# Patient Record
Sex: Female | Born: 1940 | Race: White | Hispanic: No | Smoking: Former smoker
Health system: Southern US, Community
[De-identification: ages and names within clinical notes are randomized; demographics above are authoritative.]

## PROBLEM LIST (undated history)

## (undated) DIAGNOSIS — I10 Essential (primary) hypertension: Secondary | ICD-10-CM

## (undated) DIAGNOSIS — M1990 Unspecified osteoarthritis, unspecified site: Secondary | ICD-10-CM

## (undated) DIAGNOSIS — Z87442 Personal history of urinary calculi: Secondary | ICD-10-CM

## (undated) DIAGNOSIS — Z9889 Other specified postprocedural states: Secondary | ICD-10-CM

## (undated) DIAGNOSIS — I499 Cardiac arrhythmia, unspecified: Secondary | ICD-10-CM

## (undated) DIAGNOSIS — I2510 Atherosclerotic heart disease of native coronary artery without angina pectoris: Secondary | ICD-10-CM

## (undated) DIAGNOSIS — E039 Hypothyroidism, unspecified: Secondary | ICD-10-CM

## (undated) DIAGNOSIS — N189 Chronic kidney disease, unspecified: Secondary | ICD-10-CM

## (undated) DIAGNOSIS — E119 Type 2 diabetes mellitus without complications: Secondary | ICD-10-CM

## (undated) DIAGNOSIS — R112 Nausea with vomiting, unspecified: Secondary | ICD-10-CM

## (undated) DIAGNOSIS — K219 Gastro-esophageal reflux disease without esophagitis: Secondary | ICD-10-CM

## (undated) DIAGNOSIS — Z9289 Personal history of other medical treatment: Secondary | ICD-10-CM

## (undated) HISTORY — PX: CORONARY ARTERY BYPASS GRAFT: SHX141

## (undated) HISTORY — PX: BREAST SURGERY: SHX581

## (undated) HISTORY — PX: ABDOMINAL HYSTERECTOMY: SHX81

## (undated) HISTORY — PX: APPENDECTOMY: SHX54

## (undated) HISTORY — PX: TONSILLECTOMY: SUR1361

---

## 2012-04-09 ENCOUNTER — Encounter (HOSPITAL_COMMUNITY): Payer: Self-pay | Admitting: Pharmacy Technician

## 2012-04-12 ENCOUNTER — Ambulatory Visit (HOSPITAL_COMMUNITY)
Admission: RE | Admit: 2012-04-12 | Discharge: 2012-04-12 | Disposition: A | Discharge status: Home / Self Care | Payer: Medicare Other | Source: Ambulatory Visit | Attending: Orthopedic Surgery | Admitting: Orthopedic Surgery

## 2012-04-12 ENCOUNTER — Encounter (HOSPITAL_COMMUNITY): Payer: Self-pay

## 2012-04-12 ENCOUNTER — Encounter (HOSPITAL_COMMUNITY)
Admission: RE | Admit: 2012-04-12 | Discharge: 2012-04-12 | Disposition: A | Discharge status: Home / Self Care | Payer: Medicare Other | Source: Ambulatory Visit | Attending: Orthopedic Surgery | Admitting: Orthopedic Surgery

## 2012-04-12 DIAGNOSIS — Z01818 Encounter for other preprocedural examination: Secondary | ICD-10-CM | POA: Insufficient documentation

## 2012-04-12 DIAGNOSIS — Z01812 Encounter for preprocedural laboratory examination: Secondary | ICD-10-CM | POA: Insufficient documentation

## 2012-04-12 DIAGNOSIS — M1990 Unspecified osteoarthritis, unspecified site: Secondary | ICD-10-CM

## 2012-04-12 DIAGNOSIS — M169 Osteoarthritis of hip, unspecified: Secondary | ICD-10-CM | POA: Insufficient documentation

## 2012-04-12 DIAGNOSIS — M1610 Unilateral primary osteoarthritis, unspecified hip: Secondary | ICD-10-CM | POA: Insufficient documentation

## 2012-04-12 HISTORY — DX: Nausea with vomiting, unspecified: R11.2

## 2012-04-12 HISTORY — DX: Unspecified osteoarthritis, unspecified site: M19.90

## 2012-04-12 HISTORY — DX: Hypothyroidism, unspecified: E03.9

## 2012-04-12 HISTORY — DX: Personal history of urinary calculi: Z87.442

## 2012-04-12 HISTORY — DX: Other specified postprocedural states: Z98.890

## 2012-04-12 HISTORY — DX: Cardiac arrhythmia, unspecified: I49.9

## 2012-04-12 HISTORY — DX: Essential (primary) hypertension: I10

## 2012-04-12 HISTORY — DX: Gastro-esophageal reflux disease without esophagitis: K21.9

## 2012-04-12 LAB — SURGICAL PCR SCREEN: MRSA, PCR: NEGATIVE

## 2012-04-12 LAB — URINALYSIS, ROUTINE W REFLEX MICROSCOPIC
Bilirubin Urine: NEGATIVE
Glucose, UA: NEGATIVE mg/dL
Protein, ur: NEGATIVE mg/dL
Urobilinogen, UA: 0.2 mg/dL (ref 0.0–1.0)

## 2012-04-12 LAB — CBC
MCH: 30.6 pg (ref 26.0–34.0)
MCHC: 34 g/dL (ref 30.0–36.0)
MCV: 89.9 fL (ref 78.0–100.0)
Platelets: 318 10*3/uL (ref 150–400)
RBC: 4.15 MIL/uL (ref 3.87–5.11)
RDW: 12.9 % (ref 11.5–15.5)

## 2012-04-12 LAB — BASIC METABOLIC PANEL
BUN: 27 mg/dL — ABNORMAL HIGH (ref 6–23)
CO2: 27 mEq/L (ref 19–32)
Calcium: 10.1 mg/dL (ref 8.4–10.5)
Creatinine, Ser: 1.25 mg/dL — ABNORMAL HIGH (ref 0.50–1.10)
GFR calc non Af Amer: 43 mL/min — ABNORMAL LOW (ref >90)
Glucose, Bld: 109 mg/dL — ABNORMAL HIGH (ref 70–99)
Sodium: 136 mEq/L (ref 135–145)

## 2012-04-12 LAB — URINE MICROSCOPIC-ADD ON

## 2012-04-12 LAB — PROTIME-INR: Prothrombin Time: 12.6 seconds (ref 11.6–15.2)

## 2012-04-12 NOTE — Patient Instructions (Addendum)
20 RECIA SONS  04/12/2012   Your procedure is scheduled on:  12-19 -2013  Report to [facility] at    0630    AM.  Call this number if you have problems the morning of surgery: [phone]  Or Presurgical Testing [phone](Jendaya Gossett)   Donot eat or drink past midnight night before.  Take these medicines the morning of surgery with A SIP OF WATER: Amlodipine. Metoprolol. Omeprazole. Tylenol.   Do not wear jewelry, make-up or nail polish.  Do not wear lotions, powders, or perfumes. You may wear deodorant.  Do not shave 48 hours prior to surgery.(face and neck okay, no shaving of legs)  Do not bring valuables to the hospital.  Contacts, dentures or bridgework may not be worn into surgery.  Leave suitcase in the car. After surgery it may be brought to your room.  For patients admitted to the hospital, checkout time is 11:00 AM the day of discharge.   Patients discharged the day of surgery will not be allowed to drive home. Must have responsible person with you x 24 hours once discharged.  Name and phone number of your driver: Merton Border [phone]  Special Instructions: CHG Shower Use Special Wash: see special instructions.(avoid face and genitals)   Please read over the following fact sheets that you were given: MRSA Information, Blood Transfusion fact sheet, Incentive Spirometry Instruction.    Failure to follow these instructions may result in Cancellation of your surgery.   Patient signature_______________________________________________________

## 2012-04-12 NOTE — Progress Notes (Signed)
04-12-12 labs viewable in Epic, Please note BMP, Urine. CXR reports- done with Presurgical testing visit.W. Kennon Portela

## 2012-04-14 LAB — URINE CULTURE

## 2012-04-15 NOTE — Pre-Procedure Instructions (Signed)
04-12-12 CXR report and labs-noting urinalysis -faxed to Dr. Nilsa Nutting office. Viewable in Epic.Kathleen Raymond

## 2012-04-17 NOTE — Anesthesia Preprocedure Evaluation (Addendum)
Anesthesia Evaluation  Patient identified by MRN, date of birth, ID band Patient awake    Reviewed: Allergy & Precautions, H&P , NPO status , Patient's Chart, lab work & pertinent test results, reviewed documented beta blocker date and time   History of Anesthesia Complications (+) PONV  Airway Mallampati: II TM Distance: >3 FB Neck ROM: Full    Dental  (+) Dental Advisory Given and Teeth Intact   Pulmonary former smoker,  breath sounds clear to auscultation  Pulmonary exam normal       Cardiovascular hypertension, Pt. on home beta blockers and Pt. on medications - Past MI + dysrhythmias Atrial Fibrillation Rhythm:Regular Rate:Normal     Neuro/Psych negative neurological ROS  negative psych ROS   GI/Hepatic Neg liver ROS, GERD-  Medicated,  Endo/Other  Hypothyroidism   Renal/GU negative Renal ROS     Musculoskeletal  (+) Arthritis -, Osteoarthritis,    Abdominal (+) + obese,   Peds  Hematology negative hematology ROS (+)   Anesthesia Other Findings   Reproductive/Obstetrics negative OB ROS                         Anesthesia Physical Anesthesia Plan  ASA: II  Anesthesia Plan: Spinal   Post-op Pain Management:    Induction:   Airway Management Planned: Simple Face Mask  Additional Equipment:   Intra-op Plan:   Post-operative Plan:   Informed Consent: I have reviewed the patients History and Physical, chart, labs and discussed the procedure including the risks, benefits and alternatives for the proposed anesthesia with the patient or authorized representative who has indicated his/her understanding and acceptance.   Dental advisory given  Plan Discussed with: CRNA  Anesthesia Plan Comments:         Anesthesia Quick Evaluation

## 2012-04-17 NOTE — H&P (Signed)
TOTAL HIP ADMISSION H&P  Patient is admitted for right total hip arthroplasty, anterior approach.  Subjective:  Chief Complaint: Right hip OA / pain  HPI: Kathleen Raymond, 71 y.o. female, has a history of pain and functional disability in the right hip(s) due to arthritis and patient has failed non-surgical conservative treatments for greater than 12 weeks to include NSAID's and/or analgesics, corticosteriod injections and activity modification.  Onset of symptoms was gradual starting 3 years ago with gradually worsening course since that time.The patient noted no past surgery on the right hip(s).  Patient currently rates pain in the right hip at 7 out of 10 with activity. Patient has worsening of pain with activity and weight bearing, trendelenberg gait, pain that interfers with activities of daily living and pain with passive range of motion. Patient has evidence of periarticular osteophytes and joint space narrowing by imaging studies. This condition presents safety issues increasing the risk of falls. There is no current active infection. Risks, benefits and expectations were discussed with the patient. Patient understand the risks, benefits and expectations and wishes to proceed with surgery.   D/C Plans:  Home with HHPT  Post-op Meds:   Rx given for ASA, Robaxin, Iron, Colace and MiraLax  Tranexamic Acid:  To be given  Decadron:   To be given    Past Medical History  Diagnosis Date  . PONV (postoperative nausea and vomiting)   . Hypertension   . Dysrhythmia     past hx. Atrial Fib-no problems now  . Hypothyroidism   . History of kidney stones     x2-lithotripsy done  . GERD (gastroesophageal reflux disease)   . Arthritis 04-12-12    osteoarthritis    Past Surgical History  Procedure Date  . Breast surgery     Bilateral mastectomy/reconstruction-"hx. mutant  gene for breast cancer"  . Abdominal hysterectomy     with BSO, and Anteriot and Posterior repair  . Tonsillectomy    . Appendectomy     No prescriptions prior to admission   No Known Allergies  History  Substance Use Topics  . Smoking status: Former Smoker    Quit date: 04/12/1977  . Smokeless tobacco: Not on file  . Alcohol Use: Yes     Comment: rare occ.       Review of Systems  Constitutional: Negative.   HENT: Negative.   Eyes: Negative.   Respiratory: Negative.   Cardiovascular: Negative.   Gastrointestinal: Negative.   Genitourinary: Negative.   Musculoskeletal: Positive for joint pain.  Skin: Negative.   Neurological: Negative.   Endo/Heme/Allergies: Negative.     Objective:  Physical Exam  Constitutional: She is oriented to person, place, and time. She appears well-developed and well-nourished.  HENT:  Head: Normocephalic and atraumatic.  Mouth/Throat: Oropharynx is clear and moist.  Eyes: Pupils are equal, round, and reactive to light.  Neck: Neck supple. No JVD present. No tracheal deviation present. No thyromegaly present.  Cardiovascular: Normal rate, regular rhythm, normal heart sounds and intact distal pulses.   Respiratory: Effort normal and breath sounds normal. No stridor. No respiratory distress. She has no wheezes.  GI: Soft. There is no tenderness. There is no guarding.  Musculoskeletal:       Right hip: She exhibits decreased range of motion, decreased strength, tenderness and bony tenderness. She exhibits no swelling, no deformity and no laceration.  Lymphadenopathy:    She has no cervical adenopathy.  Neurological: She is alert and oriented to person, place, and time.  Skin: Skin is warm and dry.  Psychiatric: She has a normal mood and affect.      Imaging Review Plain radiographs demonstrate severe degenerative joint disease of the right hip(s). The bone quality appears to be good for age and reported activity level.  Assessment/Plan:  End stage arthritis, right hip(s)  The patient history, physical examination, clinical judgement of the provider  and imaging studies are consistent with end stage degenerative joint disease of the right hip(s) and total hip arthroplasty is deemed medically necessary. The treatment options including medical management, injection therapy, arthroscopy and arthroplasty were discussed at length. The risks and benefits of total hip arthroplasty were presented and reviewed. The risks due to aseptic loosening, infection, stiffness, dislocation/subluxation,  thromboembolic complications and other imponderables were discussed.  The patient acknowledged the explanation, agreed to proceed with the plan and consent was signed. Patient is being admitted for inpatient treatment for surgery, pain control, PT, OT, prophylactic antibiotics, VTE prophylaxis, progressive ambulation and ADL's and discharge planning.The patient is planning to be discharged home with home health services.     Kathleen Raymond Kathleen Raymond   PAC  04/17/2012, 5:28 PM

## 2012-04-18 ENCOUNTER — Inpatient Hospital Stay (HOSPITAL_COMMUNITY): Payer: Medicare Other | Admitting: Anesthesiology

## 2012-04-18 ENCOUNTER — Encounter (HOSPITAL_COMMUNITY): Payer: Self-pay | Admitting: Orthopedic Surgery

## 2012-04-18 ENCOUNTER — Inpatient Hospital Stay (HOSPITAL_COMMUNITY): Payer: Medicare Other

## 2012-04-18 ENCOUNTER — Encounter (HOSPITAL_COMMUNITY)
Admission: RE | Disposition: A | Discharge status: Home Health | Payer: Self-pay | Source: Ambulatory Visit | Attending: Orthopedic Surgery

## 2012-04-18 ENCOUNTER — Encounter (HOSPITAL_COMMUNITY): Payer: Self-pay | Admitting: Anesthesiology

## 2012-04-18 ENCOUNTER — Inpatient Hospital Stay (HOSPITAL_COMMUNITY)
Admission: RE | Admit: 2012-04-18 | Discharge: 2012-04-19 | DRG: 470 | Disposition: A | Discharge status: Home Health | Payer: Medicare Other | Source: Ambulatory Visit | Attending: Orthopedic Surgery | Admitting: Orthopedic Surgery

## 2012-04-18 ENCOUNTER — Encounter (HOSPITAL_COMMUNITY): Payer: Self-pay | Admitting: *Deleted

## 2012-04-18 DIAGNOSIS — M1610 Unilateral primary osteoarthritis, unspecified hip: Principal | ICD-10-CM | POA: Diagnosis present

## 2012-04-18 DIAGNOSIS — D500 Iron deficiency anemia secondary to blood loss (chronic): Secondary | ICD-10-CM

## 2012-04-18 DIAGNOSIS — Z96649 Presence of unspecified artificial hip joint: Secondary | ICD-10-CM

## 2012-04-18 DIAGNOSIS — D62 Acute posthemorrhagic anemia: Secondary | ICD-10-CM | POA: Diagnosis not present

## 2012-04-18 DIAGNOSIS — E669 Obesity, unspecified: Secondary | ICD-10-CM | POA: Diagnosis present

## 2012-04-18 DIAGNOSIS — M169 Osteoarthritis of hip, unspecified: Principal | ICD-10-CM | POA: Diagnosis present

## 2012-04-18 DIAGNOSIS — E871 Hypo-osmolality and hyponatremia: Secondary | ICD-10-CM | POA: Diagnosis not present

## 2012-04-18 HISTORY — PX: TOTAL HIP ARTHROPLASTY: SHX124

## 2012-04-18 LAB — TYPE AND SCREEN: Antibody Screen: NEGATIVE

## 2012-04-18 LAB — ABO/RH: ABO/RH(D): O POS

## 2012-04-18 SURGERY — ARTHROPLASTY, HIP, TOTAL, ANTERIOR APPROACH
Anesthesia: Spinal | Site: Hip | Laterality: Right | Wound class: Clean

## 2012-04-18 MED ORDER — ONDANSETRON HCL 4 MG PO TABS: 4.0000 mg | ORAL_TABLET | Freq: Four times a day (QID) | ORAL | Status: DC | PRN

## 2012-04-18 MED ORDER — FERROUS SULFATE 325 (65 FE) MG PO TABS
325.0000 mg | ORAL_TABLET | Freq: Three times a day (TID) | ORAL | Status: DC
  Administered 2012-04-18 – 2012-04-19 (×2): 325 mg via ORAL
  Filled 2012-04-18 (×5): qty 1

## 2012-04-18 MED ORDER — ACETAMINOPHEN 10 MG/ML IV SOLN
INTRAVENOUS | Status: DC | PRN
  Administered 2012-04-18: 1000 mg via INTRAVENOUS

## 2012-04-18 MED ORDER — RIVAROXABAN 10 MG PO TABS
10.0000 mg | ORAL_TABLET | Freq: Every day | ORAL | Status: DC
  Administered 2012-04-19: 10 mg via ORAL
  Filled 2012-04-18 (×2): qty 1

## 2012-04-18 MED ORDER — HYDROMORPHONE HCL PF 1 MG/ML IJ SOLN: 0.5000 mg | INTRAMUSCULAR | Status: DC | PRN

## 2012-04-18 MED ORDER — DIPHENHYDRAMINE HCL 25 MG PO CAPS: 25.0000 mg | ORAL_CAPSULE | Freq: Four times a day (QID) | ORAL | Status: DC | PRN

## 2012-04-18 MED ORDER — BUPIVACAINE HCL (PF) 0.5 % IJ SOLN
INTRAMUSCULAR | Status: DC | PRN
  Administered 2012-04-18: 3 mL

## 2012-04-18 MED ORDER — PHENYLEPHRINE HCL 10 MG/ML IJ SOLN
INTRAMUSCULAR | Status: DC | PRN
  Administered 2012-04-18: 40 ug via INTRAVENOUS
  Administered 2012-04-18 (×2): 80 ug via INTRAVENOUS
  Administered 2012-04-18 (×4): 40 ug via INTRAVENOUS

## 2012-04-18 MED ORDER — POLYETHYLENE GLYCOL 3350 17 G PO PACK
17.0000 g | PACK | Freq: Two times a day (BID) | ORAL | Status: DC
  Administered 2012-04-18 – 2012-04-19 (×2): 17 g via ORAL

## 2012-04-18 MED ORDER — ONDANSETRON HCL 4 MG/2ML IJ SOLN
INTRAMUSCULAR | Status: DC | PRN
  Administered 2012-04-18: 4 mg via INTRAVENOUS

## 2012-04-18 MED ORDER — LOSARTAN POTASSIUM 50 MG PO TABS
50.0000 mg | ORAL_TABLET | Freq: Two times a day (BID) | ORAL | Status: DC
  Administered 2012-04-18 – 2012-04-19 (×2): 50 mg via ORAL
  Filled 2012-04-18 (×3): qty 1

## 2012-04-18 MED ORDER — METOCLOPRAMIDE HCL 5 MG/ML IJ SOLN
INTRAMUSCULAR | Status: DC | PRN
  Administered 2012-04-18: 10 mg via INTRAVENOUS

## 2012-04-18 MED ORDER — LOSARTAN POTASSIUM-HCTZ 50-12.5 MG PO TABS: 1.0000 | ORAL_TABLET | Freq: Two times a day (BID) | ORAL | Status: DC

## 2012-04-18 MED ORDER — CHLORHEXIDINE GLUCONATE 4 % EX LIQD
60.0000 mL | Freq: Once | CUTANEOUS | Status: DC
  Filled 2012-04-18: qty 60

## 2012-04-18 MED ORDER — DEXAMETHASONE SODIUM PHOSPHATE 10 MG/ML IJ SOLN: 10.0000 mg | Freq: Once | INTRAMUSCULAR | Status: DC

## 2012-04-18 MED ORDER — LACTATED RINGERS IV SOLN
INTRAVENOUS | Status: DC | PRN
  Administered 2012-04-18 (×3): via INTRAVENOUS

## 2012-04-18 MED ORDER — CEFAZOLIN SODIUM-DEXTROSE 2-3 GM-% IV SOLR
2.0000 g | INTRAVENOUS | Status: AC
  Administered 2012-04-18: 2 g via INTRAVENOUS

## 2012-04-18 MED ORDER — METOCLOPRAMIDE HCL 10 MG PO TABS: 5.0000 mg | ORAL_TABLET | Freq: Three times a day (TID) | ORAL | Status: DC | PRN

## 2012-04-18 MED ORDER — ONDANSETRON HCL 4 MG/2ML IJ SOLN: 4.0000 mg | Freq: Four times a day (QID) | INTRAMUSCULAR | Status: DC | PRN

## 2012-04-18 MED ORDER — ALUM & MAG HYDROXIDE-SIMETH 200-200-20 MG/5ML PO SUSP: 30.0000 mL | ORAL | Status: DC | PRN

## 2012-04-18 MED ORDER — TRANEXAMIC ACID 100 MG/ML IV SOLN
15.0000 mg/kg | Freq: Once | INTRAVENOUS | Status: AC
  Administered 2012-04-18: 1128 mg via INTRAVENOUS
  Filled 2012-04-18: qty 12.8

## 2012-04-18 MED ORDER — METHOCARBAMOL 500 MG PO TABS
500.0000 mg | ORAL_TABLET | Freq: Four times a day (QID) | ORAL | Status: DC | PRN
  Administered 2012-04-18 (×2): 500 mg via ORAL
  Filled 2012-04-18 (×2): qty 1

## 2012-04-18 MED ORDER — DOCUSATE SODIUM 100 MG PO CAPS: 100.0000 mg | ORAL_CAPSULE | Freq: Two times a day (BID) | ORAL | Status: DC

## 2012-04-18 MED ORDER — AMLODIPINE BESYLATE 5 MG PO TABS
5.0000 mg | ORAL_TABLET | Freq: Every morning | ORAL | Status: DC
  Filled 2012-04-18: qty 1

## 2012-04-18 MED ORDER — BISACODYL 10 MG RE SUPP: 10.0000 mg | Freq: Every day | RECTAL | Status: DC | PRN

## 2012-04-18 MED ORDER — PANTOPRAZOLE SODIUM 40 MG PO TBEC
40.0000 mg | DELAYED_RELEASE_TABLET | Freq: Every day | ORAL | Status: DC
  Administered 2012-04-18 – 2012-04-19 (×2): 40 mg via ORAL
  Filled 2012-04-18 (×2): qty 1

## 2012-04-18 MED ORDER — METHOCARBAMOL 100 MG/ML IJ SOLN
500.0000 mg | Freq: Four times a day (QID) | INTRAVENOUS | Status: DC | PRN
  Filled 2012-04-18: qty 5

## 2012-04-18 MED ORDER — SODIUM CHLORIDE 0.9 % IV SOLN
100.0000 mL/h | INTRAVENOUS | Status: DC
  Administered 2012-04-18: 100 mL/h via INTRAVENOUS
  Filled 2012-04-18 (×8): qty 1000

## 2012-04-18 MED ORDER — CELECOXIB 200 MG PO CAPS
200.0000 mg | ORAL_CAPSULE | Freq: Two times a day (BID) | ORAL | Status: DC
  Administered 2012-04-18 – 2012-04-19 (×2): 200 mg via ORAL
  Filled 2012-04-18 (×3): qty 1

## 2012-04-18 MED ORDER — ALLOPURINOL 100 MG PO TABS
200.0000 mg | ORAL_TABLET | Freq: Every day | ORAL | Status: DC
  Administered 2012-04-18: 200 mg via ORAL
  Filled 2012-04-18 (×2): qty 2

## 2012-04-18 MED ORDER — DEXAMETHASONE SODIUM PHOSPHATE 10 MG/ML IJ SOLN
INTRAMUSCULAR | Status: DC | PRN
  Administered 2012-04-18: 10 mg via INTRAVENOUS

## 2012-04-18 MED ORDER — ZOLPIDEM TARTRATE 5 MG PO TABS: 5.0000 mg | ORAL_TABLET | Freq: Every evening | ORAL | Status: DC | PRN

## 2012-04-18 MED ORDER — LEVOTHYROXINE SODIUM 125 MCG PO TABS
125.0000 ug | ORAL_TABLET | Freq: Every day | ORAL | Status: DC
  Administered 2012-04-18: 125 ug via ORAL
  Filled 2012-04-18 (×2): qty 1

## 2012-04-18 MED ORDER — CEFAZOLIN SODIUM-DEXTROSE 2-3 GM-% IV SOLR
2.0000 g | Freq: Four times a day (QID) | INTRAVENOUS | Status: AC
  Administered 2012-04-18 (×2): 2 g via INTRAVENOUS
  Filled 2012-04-18 (×2): qty 50

## 2012-04-18 MED ORDER — DOCUSATE SODIUM 100 MG PO CAPS
100.0000 mg | ORAL_CAPSULE | Freq: Two times a day (BID) | ORAL | Status: DC
  Administered 2012-04-18 – 2012-04-19 (×3): 100 mg via ORAL

## 2012-04-18 MED ORDER — HYDROCHLOROTHIAZIDE 12.5 MG PO CAPS
12.5000 mg | ORAL_CAPSULE | Freq: Two times a day (BID) | ORAL | Status: DC
  Administered 2012-04-18 – 2012-04-19 (×2): 12.5 mg via ORAL
  Filled 2012-04-18 (×3): qty 1

## 2012-04-18 MED ORDER — FLEET ENEMA 7-19 GM/118ML RE ENEM: 1.0000 | ENEMA | Freq: Once | RECTAL | Status: AC | PRN

## 2012-04-18 MED ORDER — METOCLOPRAMIDE HCL 5 MG/ML IJ SOLN: 5.0000 mg | Freq: Three times a day (TID) | INTRAMUSCULAR | Status: DC | PRN

## 2012-04-18 MED ORDER — PHENOL 1.4 % MT LIQD: 1.0000 | OROMUCOSAL | Status: DC | PRN

## 2012-04-18 MED ORDER — PHENYLEPHRINE HCL 10 MG/ML IJ SOLN
10.0000 mg | INTRAVENOUS | Status: DC | PRN
  Administered 2012-04-18: 30 ug/min via INTRAVENOUS

## 2012-04-18 MED ORDER — 0.9 % SODIUM CHLORIDE (POUR BTL) OPTIME
TOPICAL | Status: DC | PRN
  Administered 2012-04-18: 1000 mL

## 2012-04-18 MED ORDER — DEXAMETHASONE SODIUM PHOSPHATE 10 MG/ML IJ SOLN
10.0000 mg | Freq: Once | INTRAMUSCULAR | Status: DC
  Filled 2012-04-18: qty 1

## 2012-04-18 MED ORDER — HYDROCODONE-ACETAMINOPHEN 7.5-325 MG PO TABS
1.0000 | ORAL_TABLET | ORAL | Status: DC
  Administered 2012-04-18 (×2): 2 via ORAL
  Administered 2012-04-18 – 2012-04-19 (×2): 1 via ORAL
  Filled 2012-04-18 (×2): qty 2
  Filled 2012-04-18: qty 1
  Filled 2012-04-18: qty 2

## 2012-04-18 MED ORDER — METOPROLOL TARTRATE 25 MG PO TABS
25.0000 mg | ORAL_TABLET | Freq: Every morning | ORAL | Status: DC
  Filled 2012-04-18: qty 1

## 2012-04-18 MED ORDER — MENTHOL 3 MG MT LOZG: 1.0000 | LOZENGE | OROMUCOSAL | Status: DC | PRN

## 2012-04-18 SURGICAL SUPPLY — 38 items
BAG ZIPLOCK 12X15 (MISCELLANEOUS) ×4 IMPLANT
BLADE SAW SGTL 18X1.27X75 (BLADE) ×2 IMPLANT
CLOTH BEACON ORANGE TIMEOUT ST (SAFETY) ×2 IMPLANT
DERMABOND ADVANCED (GAUZE/BANDAGES/DRESSINGS) ×1
DERMABOND ADVANCED .7 DNX12 (GAUZE/BANDAGES/DRESSINGS) ×1 IMPLANT
DRAPE C-ARM 42X72 X-RAY (DRAPES) ×2 IMPLANT
DRAPE STERI IOBAN 125X83 (DRAPES) ×2 IMPLANT
DRAPE U-SHAPE 47X51 STRL (DRAPES) ×6 IMPLANT
DRSG AQUACEL AG ADV 3.5X10 (GAUZE/BANDAGES/DRESSINGS) ×2 IMPLANT
DRSG TEGADERM 4X4.75 (GAUZE/BANDAGES/DRESSINGS) ×2 IMPLANT
DURAPREP 26ML APPLICATOR (WOUND CARE) ×2 IMPLANT
ELECT BLADE TIP CTD 4 INCH (ELECTRODE) ×2 IMPLANT
ELECT REM PT RETURN 9FT ADLT (ELECTROSURGICAL) ×2
ELECTRODE REM PT RTRN 9FT ADLT (ELECTROSURGICAL) ×1 IMPLANT
EVACUATOR 1/8 PVC DRAIN (DRAIN) IMPLANT
FACESHIELD LNG OPTICON STERILE (SAFETY) ×8 IMPLANT
GAUZE SPONGE 2X2 8PLY STRL LF (GAUZE/BANDAGES/DRESSINGS) ×1 IMPLANT
GLOVE BIOGEL PI IND STRL 7.5 (GLOVE) ×1 IMPLANT
GLOVE BIOGEL PI IND STRL 8 (GLOVE) ×1 IMPLANT
GLOVE BIOGEL PI INDICATOR 7.5 (GLOVE) ×1
GLOVE BIOGEL PI INDICATOR 8 (GLOVE) ×1
GLOVE ECLIPSE 8.0 STRL XLNG CF (GLOVE) ×2 IMPLANT
GLOVE ORTHO TXT STRL SZ7.5 (GLOVE) ×4 IMPLANT
GOWN BRE IMP PREV XXLGXLNG (GOWN DISPOSABLE) ×2 IMPLANT
GOWN STRL NON-REIN LRG LVL3 (GOWN DISPOSABLE) ×2 IMPLANT
KIT BASIN OR (CUSTOM PROCEDURE TRAY) ×2 IMPLANT
PACK TOTAL JOINT (CUSTOM PROCEDURE TRAY) ×2 IMPLANT
PADDING CAST COTTON 6X4 STRL (CAST SUPPLIES) ×2 IMPLANT
SPONGE GAUZE 2X2 STER 10/PKG (GAUZE/BANDAGES/DRESSINGS) ×1
SUCTION FRAZIER 12FR DISP (SUCTIONS) IMPLANT
SUT MNCRL AB 4-0 PS2 18 (SUTURE) ×2 IMPLANT
SUT VIC AB 1 CT1 36 (SUTURE) ×6 IMPLANT
SUT VIC AB 2-0 CT1 27 (SUTURE) ×2
SUT VIC AB 2-0 CT1 TAPERPNT 27 (SUTURE) ×2 IMPLANT
SUT VLOC 180 0 24IN GS25 (SUTURE) ×2 IMPLANT
TOWEL OR 17X26 10 PK STRL BLUE (TOWEL DISPOSABLE) ×4 IMPLANT
TRAY FOLEY CATH 14FRSI W/METER (CATHETERS) ×2 IMPLANT
WATER STERILE IRR 1500ML POUR (IV SOLUTION) ×4 IMPLANT

## 2012-04-18 NOTE — Evaluation (Signed)
Physical Therapy Evaluation Patient Details Name: Kathleen Raymond MRN: [id number] DOB: 1940-07-16 Today's Date: 04/18/2012 Time: 4098-1191 PT Time Calculation (min): 24 min  PT Assessment / Plan / Recommendation Clinical Impression  Pt. is 71 yo who had R THA-direct anterior today 12/19. Pt. tolerated very well and ambulated x 200 ft. Pain is <2. Pt. plans DC tomorrow. Has 24/7 caregivers arranged.    PT Assessment  Patient needs continued PT services    Follow Up Recommendations  Home health PT;Supervision/Assistance - 24 hour    Does the patient have the potential to tolerate intense rehabilitation      Barriers to Discharge        Equipment Recommendations  None recommended by PT    Recommendations for Other Services     Frequency 7X/week    Precautions / Restrictions Precautions Precautions: None Restrictions Weight Bearing Restrictions: No   Pertinent Vitals/Pain Premedicated <2 R hip.      Mobility  Bed Mobility Bed Mobility: Supine to Sit Supine to Sit: 4: Min guard Details for Bed Mobility Assistance: cues on how to move to side. Transfers Transfers: Sit to Stand;Stand to Sit Sit to Stand: 5: Supervision Stand to Sit: 5: Supervision Details for Transfer Assistance: cues on pushing from bed, reaching back to recliner. Ambulation/Gait Ambulation/Gait Assistance: 4: Min guard Ambulation Distance (Feet): 200 Feet Ambulation/Gait Assistance Details: cues to slow pace down, cues for sequence Gait Pattern: Step-through pattern;Decreased stance time - right    Shoulder Instructions     Exercises Total Joint Exercises Quad Sets: AROM;Right;10 reps;Supine Heel Slides: Right;AROM;10 reps;Supine Hip ABduction/ADduction: AAROM;Right;10 reps;Supine   PT Diagnosis: Difficulty walking  PT Problem List: Decreased strength;Decreased range of motion;Decreased activity tolerance PT Treatment Interventions: DME instruction;Gait training;Functional mobility  training;Therapeutic exercise;Therapeutic activities   PT Goals Acute Rehab PT Goals PT Goal Formulation: With patient/family Time For Goal Achievement: 04/25/12 Potential to Achieve Goals: Good Pt will go Supine/Side to Sit: Independently PT Goal: Supine/Side to Sit - Progress: Goal set today Pt will go Sit to Supine/Side: Independently PT Goal: Sit to Supine/Side - Progress: Goal set today Pt will go Sit to Stand: with supervision PT Goal: Sit to Stand - Progress: Goal set today Pt will go Stand to Sit: with supervision PT Goal: Stand to Sit - Progress: Goal set today Pt will Ambulate: >150 feet;with supervision;with rolling walker PT Goal: Ambulate - Progress: Goal set today Pt will Perform Home Exercise Program: Independently PT Goal: Perform Home Exercise Program - Progress: Goal set today  Visit Information  Last PT Received On: 04/18/12 Assistance Needed: +1    Subjective Data  Subjective: I want to walk. Patient Stated Goal: to go home tomorrow.   Prior Functioning  Home Living Lives With: Alone Available Help at Discharge: Family Type of Home: House Home Access: Level entry Home Layout: One level Bathroom Toilet: Handicapped height Home Adaptive Equipment: Environmental consultant - rolling;Straight cane Prior Function Level of Independence: Independent Driving: Yes Vocation: Retired Musician: No difficulties    Cognition  Overall Cognitive Status: Appears within functional limits for tasks assessed/performed Arousal/Alertness: Awake/alert Orientation Level: Appears intact for tasks assessed Behavior During Session: [facility] for tasks performed    Extremity/Trunk Assessment Right Lower Extremity Assessment RLE ROM/Strength/Tone: [facility] for tasks assessed (able to flex hip in supine w/ no assist,) RLE Sensation: WFL - Light Touch Left Lower Extremity Assessment LLE ROM/Strength/Tone: Within functional levels Trunk Assessment Trunk Assessment: Normal   Balance     End of Session PT -  End of Session Activity Tolerance: Patient tolerated treatment well Patient left: in chair;with call bell/phone within reach;with family/visitor present Nurse Communication: Mobility status  GP     Rada Hay 04/18/2012, 4:26 PM  [phone]

## 2012-04-18 NOTE — Anesthesia Procedure Notes (Signed)
Spinal  Patient location during procedure: OR Start time: 04/18/2012 9:28 AM End time: 04/18/2012 9:33 AM Staffing Anesthesiologist: Lewie Loron R Performed by: anesthesiologist  Preanesthetic Checklist Completed: patient identified, site marked, surgical consent, pre-op evaluation, timeout performed, IV checked, risks and benefits discussed and monitors and equipment checked Spinal Block Patient position: sitting Prep: ChloraPrep Patient monitoring: heart rate, continuous pulse ox and blood pressure Approach: right paramedian Location: L2-3 Injection technique: single-shot Needle Needle type: Sprotte  Needle gauge: 25 G Needle length: 9 cm Assessment Sensory level: T8 Additional Notes Expiration date of kit checked and confirmed. Patient tolerated procedure well, without complications.

## 2012-04-18 NOTE — Op Note (Signed)
NAME:  Kathleen Raymond                ACCOUNT NO.: [id number]      MEDICAL RECORD NO.: [id number]      FACILITY:  [facility]      PHYSICIAN:  Durene Romans D  DATE OF BIRTH:  12/29/1940     DATE OF PROCEDURE:  04/18/2012                                 OPERATIVE REPORT         PREOPERATIVE DIAGNOSIS: Right  hip osteoarthritis.      POSTOPERATIVE DIAGNOSIS:  Right hip osteoarthritis.      PROCEDURE:  Right total hip replacement through an anterior approach   utilizing DePuy THR system, component size 52mm pinnacle cup, a size 36+4 neutral   Altrex liner, a size 3 Hi Tri Lock stem with a 36+1.5 delta ceramic   ball.      SURGEON:  Madlyn Frankel. Charlann Boxer, M.D.      ASSISTANT:  Lanney Gins, PA      ANESTHESIA:  Spinal.      SPECIMENS:  None.      COMPLICATIONS:  None.      BLOOD LOSS:  600 cc     DRAINS:  One Hemovac.      INDICATION OF THE PROCEDURE:  [name] is a 71 y.o. female who had   presented to office for evaluation of right hip pain.  Radiographs revealed   progressive degenerative changes with bone-on-bone   articulation to the  hip joint.  The patient had painful limited range of   motion significantly affecting their overall quality of life.  The patient was failing to    respond to conservative measures, and at this point was ready   to proceed with more definitive measures.  The patient has noted progressive   degenerative changes in his hip, progressive problems and dysfunction   with regarding the hip prior to surgery.  Consent was obtained for   benefit of pain relief.  Specific risk of infection, DVT, component   failure, dislocation, need for revision surgery, as well discussion of   the anterior versus posterior approach were reviewed.  Consent was   obtained for benefit of anterior pain relief through an anterior   approach.      PROCEDURE IN DETAIL:  The patient was brought to operative theater.   Once adequate  anesthesia, preoperative antibiotics, 2gm Ancef administered.   The patient was positioned supine on the OSI Hanna table.  Once adequate   padding of boney process was carried out, we had predraped out the hip, and  used fluoroscopy to confirm orientation of the pelvis and position.      The right hip was then prepped and draped from proximal iliac crest to   mid thigh with shower curtain technique.      Time-out was performed identifying the patient, planned procedure, and   extremity.     An incision was then made 2 cm distal and lateral to the   anterior superior iliac spine extending over the orientation of the   tensor fascia lata muscle and sharp dissection was carried down to the   fascia of the muscle and protractor placed in the soft tissues.      The fascia was then incised.  The muscle belly was identified and  swept   laterally and retractor placed along the superior neck.  Following   cauterization of the circumflex vessels and removing some pericapsular   fat, a second cobra retractor was placed on the inferior neck.  A third   retractor was placed on the anterior acetabulum after elevating the   anterior rectus.  A L-capsulotomy was along the line of the   superior neck to the trochanteric fossa, then extended proximally and   distally.  Tag sutures were placed and the retractors were then placed   intracapsular.  We then identified the trochanteric fossa and   orientation of my neck cut, confirmed this radiographically   and then made a neck osteotomy with the femur on traction.  The femoral   head was removed without difficulty or complication.  Traction was let   off and retractors were placed posterior and anterior around the   acetabulum.      The labrum and foveal tissue were debrided.  I began reaming with a 45mm   reamer and reamed up to 51mm reamer with good bony bed preparation and a 52   cup was chosen.  The final 52mm Pinnacle cup was then impacted under  fluoroscopy  to confirm the depth of penetration and orientation with respect to   abduction.  A screw was placed followed by the hole eliminator.  The final   36+4 neutral Altrex liner was impacted with good visualized rim fit.  The cup was positioned anatomically within the acetabular portion of the pelvis.      At this point, the femur was rolled at 80 degrees.  Further capsule was   released off the inferior aspect of the femoral neck.  I then   released the superior capsule proximally.  The hook was placed laterally   along the femur and elevated manually and held in position with the bed   hook.  The leg was then extended and adducted with the leg rolled to 100   degrees of external rotation.  Once the proximal femur was fully   exposed, I used a box osteotome to set orientation.  I then began   broaching with the starting chili pepper broach and passed this by hand and then broached up to 3.  With the 3 broach in place I chose a high offset neck and did a trial reduction with the 36+1.5.  The offset was appropriate, leg lengths   appeared to be equal, confirmed radiographically.   Given these findings, I went ahead and dislocated the hip, repositioned all   retractors and positioned the right hip in the extended and abducted position.  The final 3 Hi Tri Lock stem was   chosen and it was impacted down to the level of neck cut.  Based on this   and the trial reduction, a 36+1.5 delta ceramic ball was chosen and   impacted onto a clean and dry trunnion, and the hip was reduced.  The   hip had been irrigated throughout the case again at this point.  I did   reapproximate the superior capsular leaflet to the anterior leaflet   using #1 Vicryl, placed a medium Hemovac drain deep.  The fascia of the   tensor fascia lata muscle was then reapproximated using #1 Vicryl.  The   remaining wound was closed with 2-0 Vicryl and running 4-0 Monocryl.   The hip was cleaned, dried, and dressed  sterilely using Dermabond and   Aquacel dressing.  Drain  site dressed separately.  She was then brought   to recovery room in stable condition tolerating the procedure well.    Lanney Gins, PA-C was present for the entirety of the case involved from   preoperative positioning, perioperative retractor management, general   facilitation of the case, as well as primary wound closure as assistant.            Madlyn Frankel Charlann Boxer, M.D.            MDO/MEDQ  D:  02/21/2011  T:  02/21/2011  Job:  [id number]      Electronically Signed by Durene Romans M.D. on 02/27/2011 09:15:38 AM

## 2012-04-18 NOTE — Progress Notes (Signed)
Utilization review completed.  

## 2012-04-18 NOTE — Preoperative (Signed)
Beta Blockers   Reason not to administer Beta Blockers:Not Applicable, took BB this am 

## 2012-04-18 NOTE — Anesthesia Postprocedure Evaluation (Signed)
Anesthesia Post Note  Patient: Kathleen Raymond  Procedure(s) Performed: Procedure(s) (LRB): TOTAL HIP ARTHROPLASTY ANTERIOR APPROACH (Right)  Anesthesia type: Spinal  Patient location: PACU  Post pain: Pain level controlled  Post assessment: Post-op Vital signs reviewed  Last Vitals: BP 134/62  Pulse 61  Temp 36.7 C (Oral)  Resp 16  Wt 188 lb (85.276 kg)  SpO2 100%  Post vital signs: Reviewed  Level of consciousness: sedated  Complications: No apparent anesthesia complications

## 2012-04-18 NOTE — Interval H&P Note (Signed)
History and Physical Interval Note:  04/18/2012 9:15 AM  Kathleen Raymond  has presented today for surgery, with the diagnosis of Osteoarthritis of the Right Hip  The various methods of treatment have been discussed with the patient and family. After consideration of risks, benefits and other options for treatment, the patient has consented to  Procedure(s) (LRB) with comments: TOTAL HIP ARTHROPLASTY ANTERIOR APPROACH (Right) as a surgical intervention .  The patient's history has been reviewed, patient examined, no change in status, stable for surgery.  I have reviewed the patient's chart and labs.  Questions were answered to the patient's satisfaction.     Shelda Pal

## 2012-04-18 NOTE — Transfer of Care (Signed)
Immediate Anesthesia Transfer of Care Note  Patient: Kathleen Raymond  Procedure(s) Performed: Procedure(s) (LRB) with comments: TOTAL HIP ARTHROPLASTY ANTERIOR APPROACH (Right)  Patient Location: PACU  Anesthesia Type:Spinal  Level of Consciousness: awake, alert  and patient cooperative  Airway & Oxygen Therapy: spontenous resp, RA   Post-op Assessment: Report given to PACU RN and Post -op Vital signs reviewed and stable, spinal T12  Post vital signs: Reviewed and stable  Complications: No apparent anesthesia complications

## 2012-04-19 ENCOUNTER — Encounter (HOSPITAL_COMMUNITY): Payer: Self-pay | Admitting: Orthopedic Surgery

## 2012-04-19 DIAGNOSIS — D500 Iron deficiency anemia secondary to blood loss (chronic): Secondary | ICD-10-CM

## 2012-04-19 DIAGNOSIS — E669 Obesity, unspecified: Secondary | ICD-10-CM

## 2012-04-19 DIAGNOSIS — E871 Hypo-osmolality and hyponatremia: Secondary | ICD-10-CM

## 2012-04-19 LAB — BASIC METABOLIC PANEL
Calcium: 9 mg/dL (ref 8.4–10.5)
GFR calc non Af Amer: 48 mL/min — ABNORMAL LOW (ref >90)
Glucose, Bld: 132 mg/dL — ABNORMAL HIGH (ref 70–99)
Sodium: 134 mEq/L — ABNORMAL LOW (ref 135–145)

## 2012-04-19 LAB — CBC
MCH: 31.2 pg (ref 26.0–34.0)
MCHC: 34.9 g/dL (ref 30.0–36.0)
Platelets: 221 10*3/uL (ref 150–400)
RDW: 13.1 % (ref 11.5–15.5)

## 2012-04-19 MED ORDER — METHOCARBAMOL 500 MG PO TABS: 500.0000 mg | ORAL_TABLET | Freq: Four times a day (QID) | ORAL | Status: DC | PRN

## 2012-04-19 MED ORDER — DIPHENHYDRAMINE HCL 25 MG PO CAPS: 25.0000 mg | ORAL_CAPSULE | Freq: Four times a day (QID) | ORAL | Status: DC | PRN

## 2012-04-19 MED ORDER — POLYETHYLENE GLYCOL 3350 17 G PO PACK: 17.0000 g | PACK | Freq: Two times a day (BID) | ORAL | Status: DC

## 2012-04-19 MED ORDER — ASPIRIN EC 325 MG PO TBEC: 325.0000 mg | DELAYED_RELEASE_TABLET | Freq: Two times a day (BID) | ORAL | Status: DC

## 2012-04-19 MED ORDER — FERROUS SULFATE 325 (65 FE) MG PO TABS: 325.0000 mg | ORAL_TABLET | Freq: Three times a day (TID) | ORAL | Status: DC

## 2012-04-19 MED ORDER — HYDROCODONE-ACETAMINOPHEN 7.5-325 MG PO TABS
1.0000 | ORAL_TABLET | ORAL | Status: DC | PRN
Start: 2012-04-19 — End: 2016-12-29

## 2012-04-19 MED ORDER — DSS 100 MG PO CAPS: 100.0000 mg | ORAL_CAPSULE | Freq: Two times a day (BID) | ORAL | Status: DC

## 2012-04-19 NOTE — Progress Notes (Signed)
CARE MANAGEMENT NOTE 04/19/2012  Patient:  Kathleen Raymond, Kathleen Raymond   Account Number:  [id number]  Date Initiated:  04/19/2012  Documentation initiated by:  Colleen Can  Subjective/Objective Assessment:   dx osteoarthritis right hip: total hip replacemnt    Pre-arranged with Genevieve Norlander for [facility] services prior to admission     Action/Plan:   Home with [facility] services..Services to start 04/20/2012  Pt discharged prior to being seen by CM   Anticipated DC Date:  04/19/2012   Anticipated DC Plan:  HOME W HOME HEALTH SERVICES      DC Planning Services  CM consult        HH arranged  HH-2 PT      [facility] agency  [facility]   Status of service:  Completed, signed off Medicare Important Message given?  NA - LOS <3 / Initial given by admissions (If response is "NO", the following Medicare IM given date fields will be blank) Discharge Disposition:  HOME W HOME HEALTH SERVICES

## 2012-04-19 NOTE — Progress Notes (Signed)
Physical Therapy Treatment Patient Details Name: Kathleen Raymond MRN: [id number] DOB: 1940/11/04 Today's Date: 04/19/2012 Time: 0865-7846 PT Time Calculation (min): 36 min  PT Assessment / Plan / Recommendation Comments on Treatment Session  POD # 1 and Pt plans to D/C to home today. Pt given handout on Anterior Approach THR TE's  and performed all.  No steps to enter the home. Pt demon good safety judgment and has met all goals to D/C today.    Follow Up Recommendations  Home health PT     Does the patient have the potential to tolerate intense rehabilitation     Barriers to Discharge        Equipment Recommendations  None recommended by PT    Recommendations for Other Services    Frequency 7X/week   Plan Discharge plan remains appropriate    Precautions / Restrictions Precautions Precautions: None Precaution Comments: Direct Anterior Approach THR Restrictions Weight Bearing Restrictions: No Other Position/Activity Restrictions: WBAT   Pertinent Vitals/Pain No c/o pain "tightness"    Mobility  Bed Mobility Bed Mobility: Sit to Supine Sit to Supine: 5: Supervision Details for Bed Mobility Assistance: increased time Transfers Transfers: Sit to Stand;Stand to Sit Sit to Stand: 5: Supervision;From chair/3-in-1 Stand to Sit: 5: Supervision;To bed Details for Transfer Assistance: good safety tech and hand placement Ambulation/Gait Ambulation/Gait Assistance: 5: Supervision Ambulation Distance (Feet): 175 Feet Assistive device: Rolling walker Ambulation/Gait Assistance Details: good alternating gait and safety cognition Gait Pattern: Step-through pattern;Decreased stance time - right    Exercises Total Joint Exercises Ankle Circles/Pumps: AROM;Both;10 reps;Supine Quad Sets: AROM;Both;10 reps;Supine Gluteal Sets: AROM;Both;10 reps;Supine Short Arc Quad: AROM;Right;10 reps;Supine Heel Slides: AROM;Right;10 reps;Supine Hip ABduction/ADduction: AROM;Right;10  reps;Supine Long Arc Quad: AROM;Right;10 reps;Seated Knee Flexion: AROM;Right;10 reps;Standing Standing Hip Extension: AROM;Right;10 reps;Standing   PT Goals                                                   progressing    Visit Information  Last PT Received On: 04/19/12 Assistance Needed: +1                   End of Session PT - End of Session Equipment Utilized During Treatment: Gait belt Activity Tolerance: Patient tolerated treatment well Patient left: in chair;with call bell/phone within reach;with family/visitor present (ICE to R hip)   Felecia Shelling  PTA [facility]  Acute  Rehab Pager     [phone]

## 2012-04-19 NOTE — Progress Notes (Signed)
   Subjective: 1 Day Post-Op Procedure(s) (LRB): TOTAL HIP ARTHROPLASTY ANTERIOR APPROACH (Right)   Patient reports pain as mild, pain well controlled. No events throughout the night. Ready to be discharged home after PT.  Objective:   VITALS:   Filed Vitals:   04/19/12 0603  BP: 121/75  Pulse: 66  Temp: 97.3 F (36.3 C)  Resp: 14    Neurovascular intact Dorsiflexion/Plantar flexion intact Incision: dressing C/D/I No cellulitis present Compartment soft  LABS  Basename 04/19/12 0420  HGB 8.8*  HCT 25.2*  WBC 10.0  PLT 221     Basename 04/19/12 0420  NA 134*  K 4.5  BUN 27*  CREATININE 1.14*  GLUCOSE 132*     Assessment/Plan: 1 Day Post-Op Procedure(s) (LRB): TOTAL HIP ARTHROPLASTY ANTERIOR APPROACH (Right) HV drain d/c'ed Foley cath d/c'ed Advance diet Up with therapy D/C IV fluids Discharge home with home health Follow [facility] 2 weeks [facility]. Follow up with OLIN,Hamish Banks D in 2 weeks.  Contact information:  [facility] [address] [place] [address] [phone]    Expected ABLA  Treated with iron and will observe  Obese (BMI 30-39.9) Estimated Body mass index is 31.12 kg/(m^2) as calculated from the following:   Height as of this encounter: 5\' 5" (1.651 m).   Weight as of this encounter: 187 lb(84.823 kg). Patient also counseled that weight may inhibit the healing process Patient counseled that losing weight will help with future health issues  Hyponatremia Treated with IV fluids and will observe       Anastasio Auerbach. Kalyb Pemble   PAC  04/19/2012, 7:30 AM

## 2012-04-19 NOTE — Evaluation (Signed)
Occupational Therapy Evaluation Patient Details Name: AMARIS DELAFUENTE MRN: [id number] DOB: Sep 10, 1940 Today's Date: 04/19/2012 Time:  -     OT Assessment / Plan / Recommendation Clinical Impression  Pt doing very well POD 1 THR (anterior). All education completed. Pt will have necessary level of A from family upon d/c and requires no equip.    OT Assessment  Patient does not need any further OT services    Follow Up Recommendations  No OT follow up    Barriers to Discharge      Equipment Recommendations  None recommended by OT    Recommendations for Other Services    Frequency       Precautions / Restrictions Precautions Precautions: None Restrictions Weight Bearing Restrictions: No   Pertinent Vitals/Pain Pt reported 2/10 pain in R hip. Repositioned and cold applied.    ADL  Grooming: Supervision/safety;Teeth care Where Assessed - Grooming: Supported standing Upper Body Bathing: Set up Where Assessed - Upper Body Bathing: Unsupported sitting Lower Body Bathing: Supervision/safety Where Assessed - Lower Body Bathing: Supported sit to stand Upper Body Dressing: Set up Where Assessed - Upper Body Dressing: Unsupported sitting Lower Body Dressing: Supervision/safety Where Assessed - Lower Body Dressing: Supported sit to Pharmacist, hospital: Supervision/safety Statistician Method: Sit to Barista: Comfort height toilet Toileting - Architect and Hygiene: Supervision/safety Where Assessed - Engineer, mining and Hygiene: Sit to stand from 3-in-1 or toilet Tub/Shower Transfer: Supervision/safety Tub/Shower Transfer Method: Science writer: Walk in Scientist, research (physical sciences) Used: Rolling walker Transfers/Ambulation Related to ADLs: Pt ambulated to the bathroom with supervision. Min cues for safe manipulation of RW at sink. ADL Comments: Pt was able to don underwear and socks without physical A or use  of an AD. Instructed pt how to safely step into a walk in shower with good return demo.    OT Diagnosis:    OT Problem List:   OT Treatment Interventions:     OT Goals    Visit Information       Subjective Data      Prior Functioning     Home Living Lives With: Alone Available Help at Discharge: Family Type of Home: House Home Access: Level entry Home Layout: One level Bathroom Shower/Tub: Health visitor: Handicapped height Home Adaptive Equipment: Straight cane;Walker - rolling Prior Function Level of Independence: Independent Driving: Yes Vocation: Retired Musician: No difficulties Dominant Hand: Right         Vision/Perception     Cognition  Overall Cognitive Status: Appears within functional limits for tasks assessed/performed Arousal/Alertness: Awake/alert Orientation Level: Appears intact for tasks assessed Behavior During Session: [facility] for tasks performed    Extremity/Trunk Assessment Right Upper Extremity Assessment RUE ROM/Strength/Tone: [facility] for tasks assessed Left Upper Extremity Assessment LUE ROM/Strength/Tone: WFL for tasks assessed     Mobility Bed Mobility Supine to Sit: 6: Modified independent (Device/Increase time);HOB elevated Transfers Sit to Stand: 5: Supervision;With upper extremity assist;From toilet;From bed Stand to Sit: 5: Supervision;With upper extremity assist;To chair/3-in-1;To toilet Details for Transfer Assistance: Pt demo'd good technique without VCs.     Shoulder Instructions     Exercise     Balance     End of Session OT - End of Session Activity Tolerance: Patient tolerated treatment well Patient left: in chair;with call bell/phone within reach  GO     Lennix Rotundo A OTR/L [phone] 04/19/2012, 8:41 AM

## 2012-04-19 NOTE — Discharge Summary (Signed)
Physician Discharge Summary  Patient ID: Kathleen Raymond MRN: [id number] DOB/AGE: 70-Dec-1942 70 y.o.  Admit date: 04/18/2012 Discharge date:  04/19/2012  Procedures:  Procedure(s) (LRB): TOTAL HIP ARTHROPLASTY ANTERIOR APPROACH (Right)  Attending Physician:  Dr. Durene Romans   Admission Diagnoses:   Right hip OA / pain  Discharge Diagnoses:  Principal Problem:  *S/P right THA, AA Active Problems:  Expected blood loss anemia  Obese  Hyponatremia   HPI: Kathleen Raymond, 71 y.o. female, has a history of pain and functional disability in the right hip(s) due to arthritis and patient has failed non-surgical conservative treatments for greater than 12 weeks to include NSAID's and/or analgesics, corticosteriod injections and activity modification. Onset of symptoms was gradual starting 3 years ago with gradually worsening course since that time.The patient noted no past surgery on the right hip(s). Patient currently rates pain in the right hip at 7 out of 10 with activity. Patient has worsening of pain with activity and weight bearing, trendelenberg gait, pain that interfers with activities of daily living and pain with passive range of motion. Patient has evidence of periarticular osteophytes and joint space narrowing by imaging studies. This condition presents safety issues increasing the risk of falls. There is no current active infection. Risks, benefits and expectations were discussed with the patient. Patient understand the risks, benefits and expectations and wishes to proceed with surgery.   PCP: Vernona Rieger, MD   Discharged Condition: good  Hospital Course:  Patient underwent the above stated procedure on 04/18/2012. Patient tolerated the procedure well and brought to the recovery room in good condition and subsequently to the floor.  POD #1 BP: 121/75 ; Pulse: 66 ; Temp: 97.3 F (36.3 C) ; Resp: 14  Pt's foley was removed, as well as the hemovac drain removed. IV  was changed to a saline lock. Patient reports pain as mild, pain well controlled. No events throughout the night. Ready to be discharged home after PT. Neurovascular intact, dorsiflexion/plantar flexion intact, incision: dressing C/D/I, no cellulitis present and compartment soft.   LABS  Basename  04/19/12 0420   HGB  8.8  HCT  25.2    Discharge Exam: General appearance: alert, cooperative and no distress Extremities: Homans sign is negative, no sign of DVT, no edema, redness or tenderness in the calves or thighs and no ulcers, gangrene or trophic changes  Disposition: Home with follow up in 2 weeks   Follow-up Information    Follow up with Shelda Pal, MD. Schedule an appointment as soon as possible for a visit in 2 weeks.   Contact information:   [address] [place] [address] [phone]          Discharge Orders    Future Orders Please Complete By Expires   Diet - low sodium heart healthy      Call MD / Call 911      Comments:   If you experience chest pain or shortness of breath, CALL 911 and be transported to the hospital emergency room.  If you develope a fever above 101 F, pus (white drainage) or increased drainage or redness at the wound, or calf pain, call your surgeon's office.   Discharge instructions      Comments:   Maintain surgical dressing for 10-14 days, then replace with gauze and tape. Keep the area dry and clean until follow up. Follow up in 2 weeks at [facility]. Call with any questions or concerns.   Constipation Prevention  Comments:   Drink plenty of fluids.  Prune juice may be helpful.  You may use a stool softener, such as Colace (over the counter) 100 mg twice a day.  Use MiraLax (over the counter) for constipation as needed.   Increase activity slowly as tolerated      Change dressing      Comments:   Maintain surgical dressing for 10-14 days, then replace with 4x4 guaze and tape. Keep the area dry and  clean.   TED hose      Comments:   Use stockings (TED hose) for 2 weeks on both leg(s).  You may remove them at night for sleeping.      Current Discharge Medication List    START taking these medications   Details  diphenhydrAMINE (BENADRYL) 25 mg capsule Take 1 capsule (25 mg total) by mouth every 6 (six) hours as needed for itching, allergies or sleep. Qty: 30 capsule    docusate sodium 100 MG CAPS Take 100 mg by mouth 2 (two) times daily. Qty: 10 capsule    ferrous sulfate 325 (65 FE) MG tablet Take 1 tablet (325 mg total) by mouth 3 (three) times daily after meals.    HYDROcodone-acetaminophen (NORCO) 7.5-325 MG per tablet Take 1-2 tablets by mouth every 4 (four) hours as needed for pain. Qty: 120 tablet, Refills: 0    methocarbamol (ROBAXIN) 500 MG tablet Take 1 tablet (500 mg total) by mouth every 6 (six) hours as needed (muscle spasms).    polyethylene glycol (MIRALAX / GLYCOLAX) packet Take 17 g by mouth 2 (two) times daily. Qty: 14 each      CONTINUE these medications which have CHANGED   Details  aspirin EC 325 MG tablet Take 1 tablet (325 mg total) by mouth 2 (two) times daily. X 4 weeks Qty: 60 tablet, Refills: 0      CONTINUE these medications which have NOT CHANGED   Details  allopurinol (ZYLOPRIM) 100 MG tablet Take 200 mg by mouth at bedtime.    amLODipine (NORVASC) 5 MG tablet Take 5 mg by mouth every morning.    fish oil-omega-3 fatty acids 1000 MG capsule Take 1 g by mouth 2 (two) times daily.    Glucosamine-Chondroitin 500-400 MG CAPS Take 3 capsules by mouth daily. Takes 1500mg /1200mg  (only one tablet)    levothyroxine (SYNTHROID, LEVOTHROID) 125 MCG tablet Take 125 mcg by mouth at bedtime.    losartan-hydrochlorothiazide (HYZAAR) 50-12.5 MG per tablet Take 1 tablet by mouth 2 (two) times daily.    metoprolol tartrate (LOPRESSOR) 25 MG tablet Take 25 mg by mouth every morning.    Multiple Vitamin (MULTIVITAMIN WITH MINERALS) TABS Take 1 tablet  by mouth daily.    omeprazole (PRILOSEC) 20 MG capsule Take 20 mg by mouth daily.      STOP taking these medications     meloxicam (MOBIC) 15 MG tablet            Signed: Anastasio Auerbach. Abia Monaco   PAC  04/19/2012, 7:38 AM

## 2012-04-19 NOTE — Progress Notes (Signed)
No changed from morning assessment.  Patient was discharged home with family.  AVS provided and reviewed with the patient and her daughter.

## 2014-01-15 IMAGING — CR DG HIP 1V PORT*R*
1 series · 1 of 1 positions shown · non-contrast
Comparison: None.

CLINICAL DATA: Status post right hip replacement

PORTABLE RIGHT HIP - 1 VIEW

[AP]
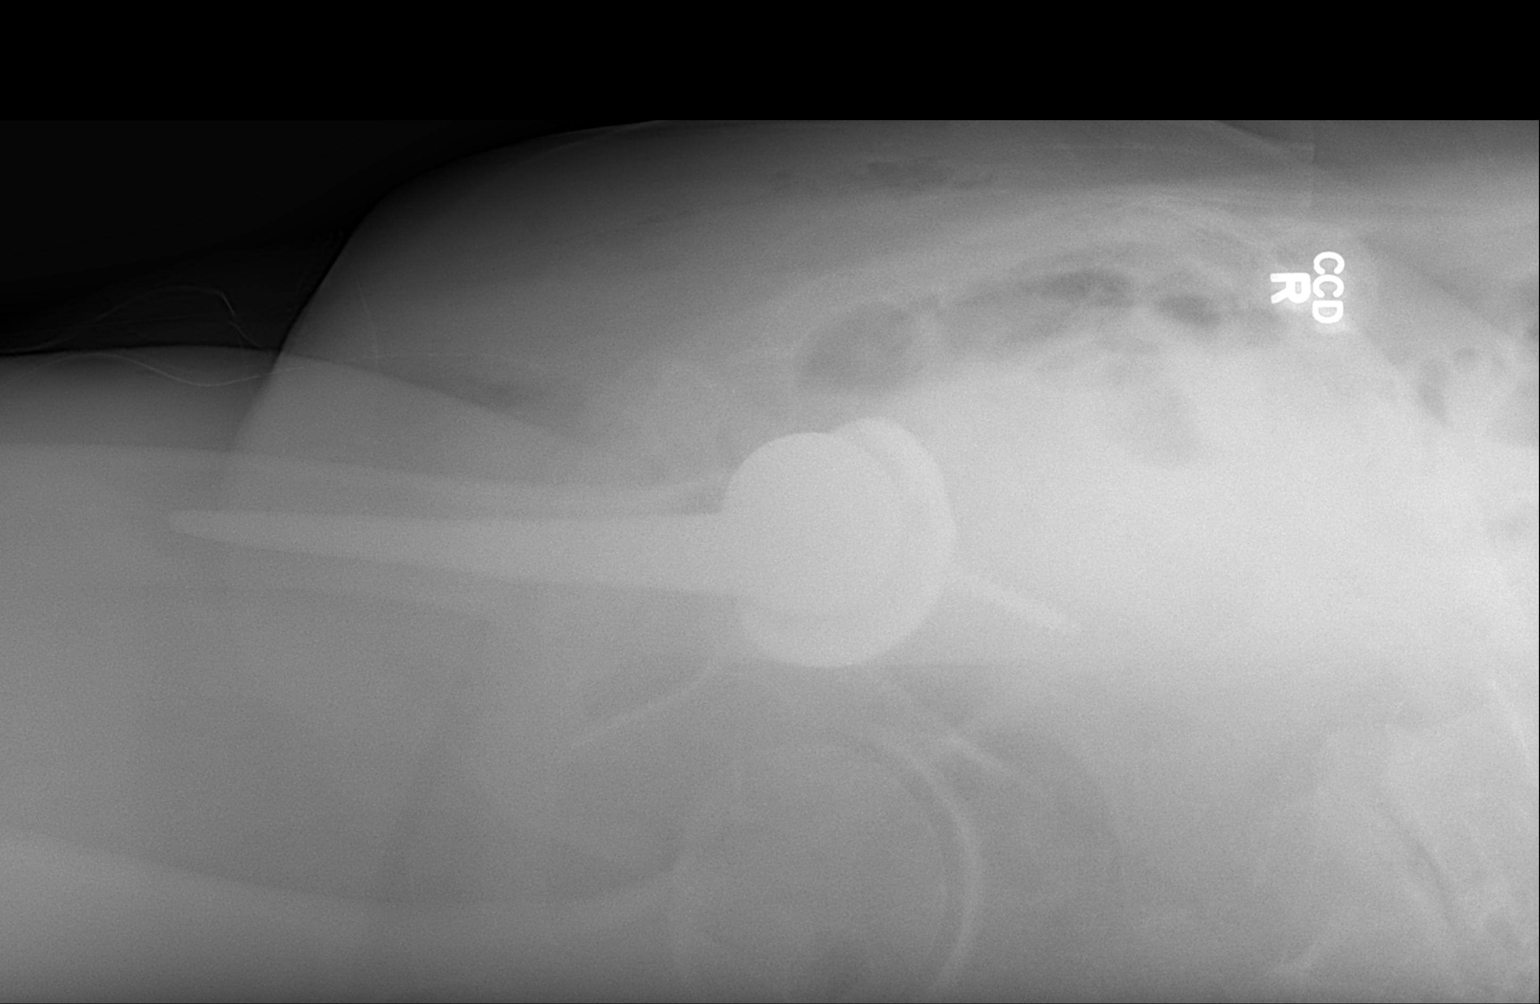

[1 of 1 positions shown; findings below may reference images not displayed]

FINDINGS: There are changes consistent with the right hip
replacement.  No dislocation is seen.  No acute bony abnormality is
noted.
IMPRESSION: Lateral view shows recent hip replacement on the right.  No acute
abnormality is noted.

## 2014-01-15 IMAGING — CR DG PORTABLE PELVIS
1 series · 1 of 1 positions shown · non-contrast
Comparison: Intraoperative films, same date.

CLINICAL DATA: Postop right total hip arthroplasty.

PORTABLE PELVIS

[AP]
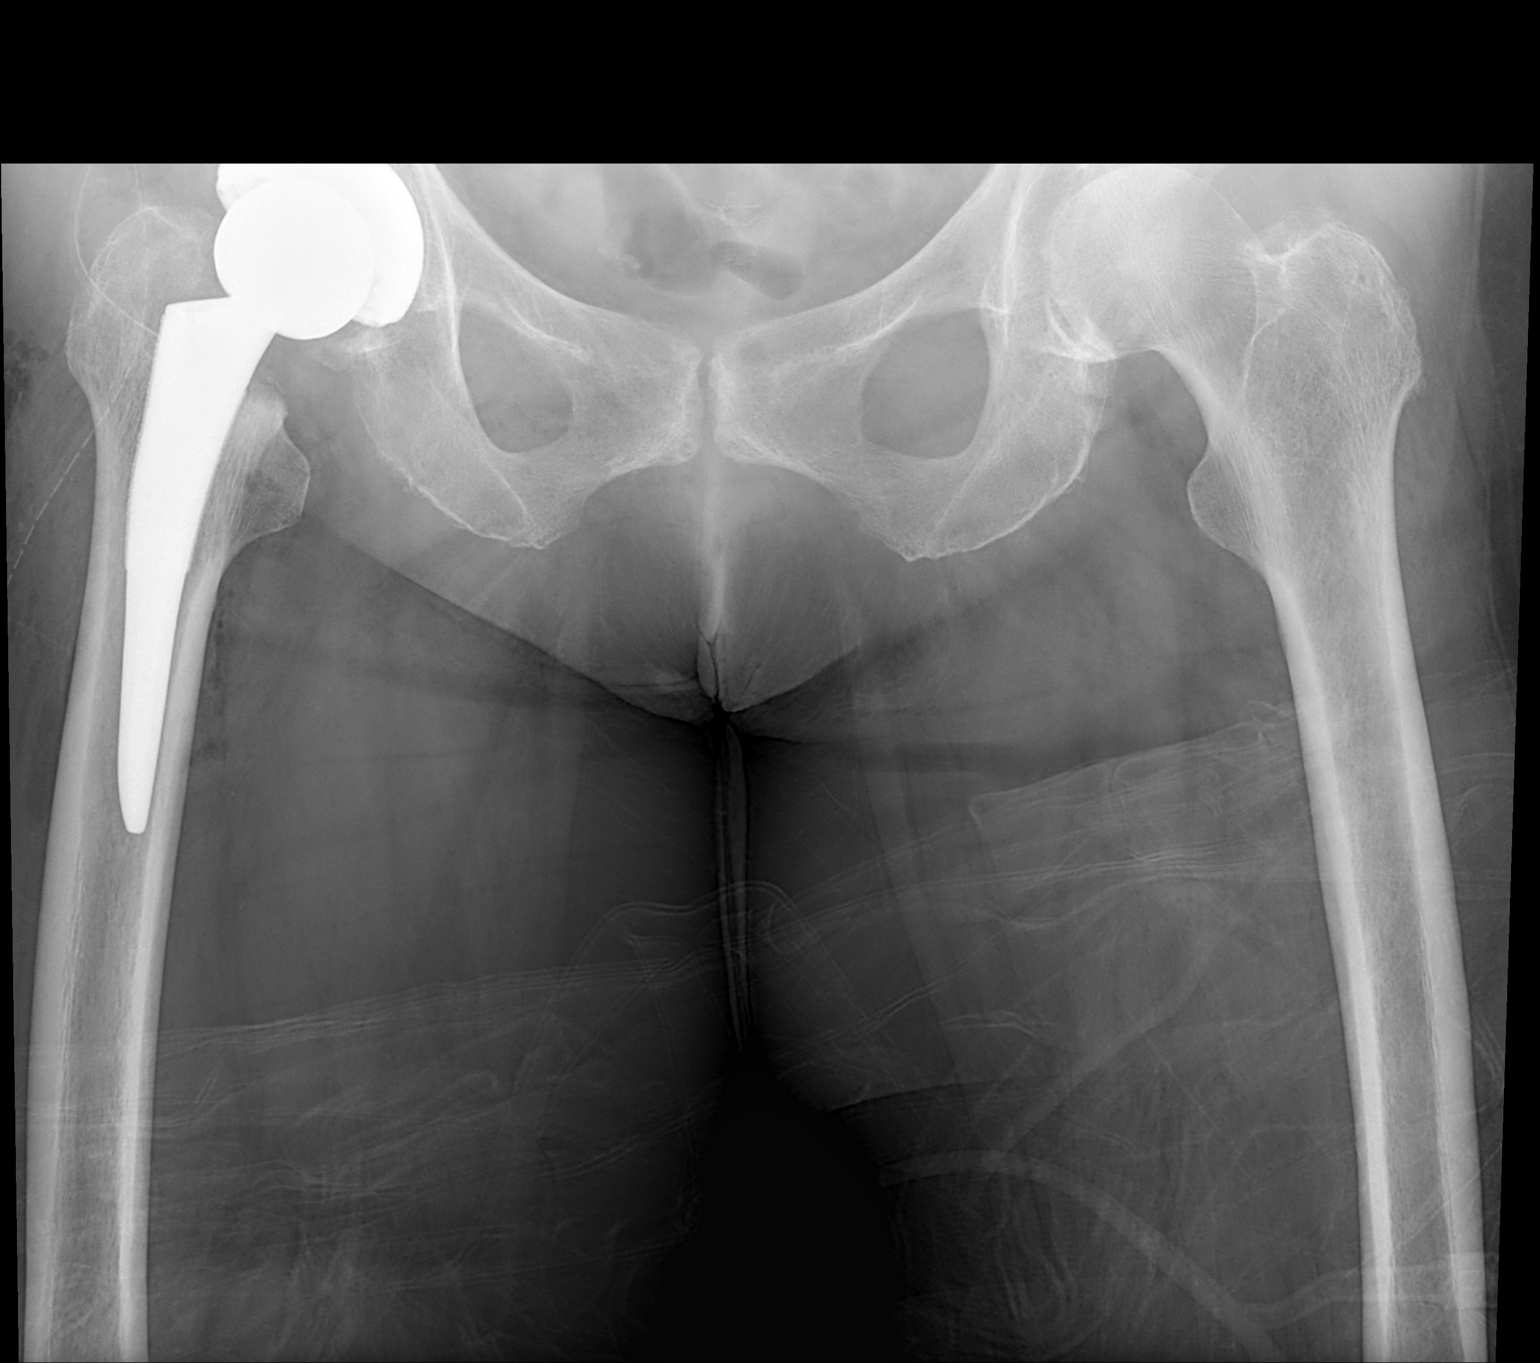

[1 of 1 positions shown; findings below may reference images not displayed]

FINDINGS: The femoral and acetabular components are well seated.
No complicating features.  The visualized lower pelvis is intact.
IMPRESSION: Well seated components of a total right hip arthroplasty.  No
complicating features.

## 2016-12-19 NOTE — Progress Notes (Signed)
Need orders in epic for 9-11 surgery 

## 2016-12-29 NOTE — Patient Instructions (Addendum)
Kathleen Raymond  12/29/2016   Your procedure is scheduled on: 01/09/17   [facility]  Follow signs to Short Stay on first floor at   0515 AM  Call this number if you have problems the morning of surgery [phone]   Remember: ONLY 1 PERSON MAY GO WITH YOU TO SHORT STAY TO GET  READY MORNING OF YOUR SURGERY.  Do not eat food or drink liquids :After Midnight.     Take these medicines the morning of surgery with A SIP OF WATER: Allopurinol, Allegra I fneeded, Synthroid, Metoprolol ( Lopressor), Prilosec if needed                                 You may not have any metal on your body including hair pins and              piercings  Do not wear jewelry, make-up, lotions, powders or perfumes, deodorant             Do not wear nail polish.  Do not shave  48 hours prior to surgery.                Do not bring valuables to the hospital. [facility] IS NOT             RESPONSIBLE   FOR VALUABLES.  Contacts, dentures or bridgework may not be worn into surgery.  Leave suitcase in the car. After surgery it may be brought to your room.                   Please read over the following fact sheets you were given: _____________________________________________________________________             [facility] - Preparing for [facility] Before surgery, you can play an important role.  Because skin is not sterile, your skin needs to be as free of germs as possible.  You can reduce the number of germs on your skin by washing with CHG (chlorahexidine gluconate) soap before surgery.  CHG is an antiseptic cleaner which kills germs and bonds with the skin to continue killing germs even after washing. Please DO NOT use if you have an allergy to CHG or antibacterial soaps.  If your skin becomes reddened/irritated stop using the CHG and inform your nurse when you arrive at Short Stay. Do not shave (including legs and underarms) for at least 48  hours prior to the first CHG shower.  You may shave your face/neck. Please follow these instructions carefully:  1.  Shower with CHG Soap the night before surgery and the  morning of Surgery.  2.  If you choose to wash your hair, wash your hair first as usual with your  normal  shampoo.  3.  After you shampoo, rinse your hair and body thoroughly to remove the  shampoo.                           4.  Use CHG as you would any other liquid soap.  You can apply chg directly  to the skin and wash                       Gently with a scrungie or clean  washcloth.  5.  Apply the CHG Soap to your body ONLY FROM THE NECK DOWN.   Do not use on face/ open                           Wound or open sores. Avoid contact with eyes, ears mouth and genitals (private parts).                       Wash face,  Genitals (private parts) with your normal soap.             6.  Wash thoroughly, paying special attention to the area where your surgery  will be performed.  7.  Thoroughly rinse your body with warm water from the neck down.  8.  DO NOT shower/wash with your normal soap after using and rinsing off  the CHG Soap.                9.  Pat yourself dry with a clean towel.            10.  Wear clean pajamas.            11.  Place clean sheets on your bed the night of your first shower and do not  sleep with pets. Day of Surgery : Do not apply any lotions/deodorants the morning of surgery.  Please wear clean clothes to the hospital/surgery center.  FAILURE TO FOLLOW THESE INSTRUCTIONS MAY RESULT IN THE CANCELLATION OF YOUR SURGERY PATIENT SIGNATURE_________________________________  NURSE SIGNATURE__________________________________  ________________________________________________________________________  WHAT IS A BLOOD TRANSFUSION? Blood Transfusion Information  A transfusion is the replacement of blood or some of its parts. Blood is made up of multiple cells which provide different functions.  Red blood cells  carry oxygen and are used for blood loss replacement.  White blood cells fight against infection.  Platelets control bleeding.  Plasma helps clot blood.  Other blood products are available for specialized needs, such as hemophilia or other clotting disorders. BEFORE THE TRANSFUSION  Who gives blood for transfusions?   Healthy volunteers who are fully evaluated to make sure their blood is safe. This is blood bank blood. Transfusion therapy is the safest it has ever been in the practice of medicine. Before blood is taken from a donor, a complete history is taken to make sure that person has no history of diseases nor engages in risky social behavior (examples are intravenous drug use or sexual activity with multiple partners). The donor's travel history is screened to minimize risk of transmitting infections, such as malaria. The donated blood is tested for signs of infectious diseases, such as HIV and hepatitis. The blood is then tested to be sure it is compatible with you in order to minimize the chance of a transfusion reaction. If you or a relative donates blood, this is often done in anticipation of surgery and is not appropriate for emergency situations. It takes many days to process the donated blood. RISKS AND COMPLICATIONS Although transfusion therapy is very safe and saves many lives, the main dangers of transfusion include:   Getting an infectious disease.  Developing a transfusion reaction. This is an allergic reaction to something in the blood you were given. Every precaution is taken to prevent this. The decision to have a blood transfusion has been considered carefully by your caregiver before blood is given. Blood is not given unless the benefits outweigh the risks. AFTER THE TRANSFUSION  Right after receiving a blood transfusion, you will usually feel much better and more energetic. This is especially true if your red blood cells have gotten low (anemic). The transfusion raises  the level of the red blood cells which carry oxygen, and this usually causes an energy increase.  The nurse administering the transfusion will monitor you carefully for complications. HOME CARE INSTRUCTIONS  No special instructions are needed after a transfusion. You may find your energy is better. Speak with your caregiver about any limitations on activity for underlying diseases you may have. SEEK MEDICAL CARE IF:   Your condition is not improving after your transfusion.  You develop redness or irritation at the intravenous (IV) site. SEEK IMMEDIATE MEDICAL CARE IF:  Any of the following symptoms occur over the next 12 hours:  Shaking chills.  You have a temperature by mouth above 102 F (38.9 C), not controlled by medicine.  Chest, back, or muscle pain.  People around you feel you are not acting correctly or are confused.  Shortness of breath or difficulty breathing.  Dizziness and fainting.  You get a rash or develop hives.  You have a decrease in urine output.  Your urine turns a dark color or changes to pink, red, or brown. Any of the following symptoms occur over the next 10 days:  You have a temperature by mouth above 102 F (38.9 C), not controlled by medicine.  Shortness of breath.  Weakness after normal activity.  The white part of the eye turns yellow (jaundice).  You have a decrease in the amount of urine or are urinating less often.  Your urine turns a dark color or changes to pink, red, or brown. Document Released: 04/14/2000 Document Revised: 07/10/2011 Document Reviewed: 12/02/2007 ExitCare Patient Information 2014 [place].  _______________________________________________________________________  Incentive Spirometer  An incentive spirometer is a tool that can help keep your lungs clear and active. This tool measures how well you are filling your lungs with each breath. Taking long deep breaths may help reverse or decrease the chance of  developing breathing (pulmonary) problems (especially infection) following:  A long period of time when you are unable to move or be active. BEFORE THE PROCEDURE   If the spirometer includes an indicator to show your best effort, your nurse or respiratory therapist will set it to a desired goal.  If possible, sit up straight or lean slightly forward. Try not to slouch.  Hold the incentive spirometer in an upright position. INSTRUCTIONS FOR USE  1. Sit on the edge of your bed if possible, or sit up as far as you can in bed or on a chair. 2. Hold the incentive spirometer in an upright position. 3. Breathe out normally. 4. Place the mouthpiece in your mouth and seal your lips tightly around it. 5. Breathe in slowly and as deeply as possible, raising the piston or the ball toward the top of the column. 6. Hold your breath for 3-5 seconds or for as long as possible. Allow the piston or ball to fall to the bottom of the column. 7. Remove the mouthpiece from your mouth and breathe out normally. 8. Rest for a few seconds and repeat Steps 1 through 7 at least 10 times every 1-2 hours when you are awake. Take your time and take a few normal breaths between deep breaths. 9. The spirometer may include an indicator to show your best effort. Use the indicator as a goal to work toward during each repetition. 10. After  each set of 10 deep breaths, practice coughing to be sure your lungs are clear. If you have an incision (the cut made at the time of surgery), support your incision when coughing by placing a pillow or rolled up towels firmly against it. Once you are able to get out of bed, walk around indoors and cough well. You may stop using the incentive spirometer when instructed by your caregiver.  RISKS AND COMPLICATIONS  Take your time so you do not get dizzy or light-headed.  If you are in pain, you may need to take or ask for pain medication before doing incentive spirometry. It is harder to take a  deep breath if you are having pain. AFTER USE  Rest and breathe slowly and easily.  It can be helpful to keep track of a log of your progress. Your caregiver can provide you with a simple table to help with this. If you are using the spirometer at home, follow these instructions: [place] IF:   You are having difficultly using the spirometer.  You have trouble using the spirometer as often as instructed.  Your pain medication is not giving enough relief while using the spirometer.  You develop fever of 100.5 F (38.1 C) or higher. SEEK IMMEDIATE MEDICAL CARE IF:   You cough up bloody sputum that had not been present before.  You develop fever of 102 F (38.9 C) or greater.  You develop worsening pain at or near the incision site. MAKE SURE YOU:   Understand these instructions.  Will watch your condition.  Will get help right away if you are not doing well or get worse. Document Released: 08/28/2006 Document Revised: 07/10/2011 Document Reviewed: 10/29/2006 [facility] Patient Information 2014 [place], [place].   ________________________________________________________________________

## 2017-01-03 ENCOUNTER — Encounter (HOSPITAL_COMMUNITY): Payer: Self-pay

## 2017-01-03 ENCOUNTER — Encounter (HOSPITAL_COMMUNITY)
Admission: RE | Admit: 2017-01-03 | Discharge: 2017-01-03 | Disposition: A | Discharge status: Home / Self Care | Payer: Medicare PPO | Source: Ambulatory Visit | Attending: Orthopedic Surgery | Admitting: Orthopedic Surgery

## 2017-01-03 DIAGNOSIS — Z01812 Encounter for preprocedural laboratory examination: Secondary | ICD-10-CM | POA: Diagnosis not present

## 2017-01-03 DIAGNOSIS — Z7982 Long term (current) use of aspirin: Secondary | ICD-10-CM | POA: Diagnosis not present

## 2017-01-03 DIAGNOSIS — Z79899 Other long term (current) drug therapy: Secondary | ICD-10-CM | POA: Diagnosis not present

## 2017-01-03 HISTORY — DX: Personal history of other medical treatment: Z92.89

## 2017-01-03 HISTORY — DX: Type 2 diabetes mellitus without complications: E11.9

## 2017-01-03 HISTORY — DX: Chronic kidney disease, unspecified: N18.9

## 2017-01-03 HISTORY — DX: Atherosclerotic heart disease of native coronary artery without angina pectoris: I25.10

## 2017-01-03 LAB — BASIC METABOLIC PANEL
Anion gap: 8 (ref 5–15)
BUN: 29 mg/dL — ABNORMAL HIGH (ref 6–20)
CHLORIDE: 102 mmol/L (ref 101–111)
CO2: 29 mmol/L (ref 22–32)
Calcium: 9.7 mg/dL (ref 8.9–10.3)
Creatinine, Ser: 1.27 mg/dL — ABNORMAL HIGH (ref 0.44–1.00)
GFR calc non Af Amer: 40 mL/min — ABNORMAL LOW (ref >60)
GFR, EST AFRICAN AMERICAN: 47 mL/min — AB (ref >60)
Glucose, Bld: 112 mg/dL — ABNORMAL HIGH (ref 65–99)
POTASSIUM: 4.8 mmol/L (ref 3.5–5.1)
SODIUM: 139 mmol/L (ref 135–145)

## 2017-01-03 LAB — CBC
HEMATOCRIT: 36.2 % (ref 36.0–46.0)
HEMOGLOBIN: 12.2 g/dL (ref 12.0–15.0)
MCH: 30.8 pg (ref 26.0–34.0)
MCHC: 33.7 g/dL (ref 30.0–36.0)
MCV: 91.4 fL (ref 78.0–100.0)
Platelets: 246 10*3/uL (ref 150–400)
RBC: 3.96 MIL/uL (ref 3.87–5.11)
RDW: 14 % (ref 11.5–15.5)
WBC: 5 10*3/uL (ref 4.0–10.5)

## 2017-01-03 LAB — HEMOGLOBIN A1C
Hgb A1c MFr Bld: 5.7 % — ABNORMAL HIGH (ref 4.8–5.6)
Mean Plasma Glucose: 116.89 mg/dL

## 2017-01-03 LAB — SURGICAL PCR SCREEN
MRSA, PCR: NEGATIVE
Staphylococcus aureus: NEGATIVE

## 2017-01-03 NOTE — Progress Notes (Signed)
DR Chestine Sporelark- 12/28/16-clearance on chart  Dr Willeen CassWahid- LOV and clearance- 12/20/16-on chart  EKG-12/25/16-on chart   ECHO- 08/08/16- on chart from Dr Chestine Sporelark

## 2017-01-03 NOTE — Progress Notes (Signed)
BMP done 01/03/17 faxed via epic to Dr Charlann Boxerlin.

## 2017-01-04 NOTE — H&P (Signed)
TOTAL HIP ADMISSION H&P  Patient is admitted for left total hip arthroplasty, anterior approach.  Subjective:  Chief Complaint: Left hip primary OA / pain  HPI: Kathleen Raymond, 76 y.o. female, has a history of pain and functional disability in the left hip(s) due to arthritis and patient has failed non-surgical conservative treatments for greater than 12 weeks to include NSAID's and/or analgesics, corticosteriod injections and activity modification.  Onset of symptoms was gradual starting <1 years ago with rapidlly worsening course since that time.The patient noted no past surgery on the left hip(s).  Patient currently rates pain in the left hip at 8 out of 10 with activity. Patient has night pain, worsening of pain with activity and weight bearing, trendelenberg gait, pain that interfers with activities of daily living and pain with passive range of motion. Patient has evidence of periarticular osteophytes and joint space narrowing by imaging studies. This condition presents safety issues increasing the risk of falls.  There is no current active infection.   Risks, benefits and expectations were discussed with the patient.  Risks including but not limited to the risk of anesthesia, blood clots, nerve damage, blood vessel damage, failure of the prosthesis, infection and up to and including death.  Patient understand the risks, benefits and expectations and wishes to proceed with surgery.   PCP: Vernona Rieger, MD  D/C Plans:       Home   Post-op Meds:       No Rx given   Tranexamic Acid:      To be given - IV    Decadron:      Is to be given  FYI:     ASA  Norco  DME:  Rx given for - RW and 3-n-1  PT:   No PT     Patient Active Problem List   Diagnosis Date Noted  . Expected blood loss anemia 04/19/2012  . Obese 04/19/2012  . Hyponatremia 04/19/2012  . 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 Arthritis 04-12-12   osteoarthritis  . Chronic  kidney disease   . Coronary artery disease   . Diabetes mellitus without complication (HCC)    ? never been on meds controlled with diet   . Dysrhythmia    past hx. Atrial Fib-no problems now  . GERD (gastroesophageal reflux disease)   . History of kidney stones    x2-lithotripsy done  . Hx of transfusion of packed red blood cells   . Hypertension   . Hypothyroidism   . PONV (postoperative nausea and vomiting)     Past Surgical History:  Procedure Laterality Date  . ABDOMINAL HYSTERECTOMY     with BSO, and Anteriot and Posterior repair  . APPENDECTOMY    . BREAST SURGERY     Bilateral mastectomy/reconstruction-"hx. mutant  gene for breast cancer"  . CORONARY ARTERY BYPASS GRAFT    . TONSILLECTOMY    . TOTAL HIP ARTHROPLASTY  04/18/2012   Procedure: TOTAL HIP ARTHROPLASTY ANTERIOR APPROACH;  Surgeon: Shelda Pal, MD;  Location: [facility] ORS;  Service: Orthopedics;  Laterality: Right;    No prescriptions prior to admission.   No Known Allergies  Social History  Substance Use Topics  . Smoking status: Former Smoker    Quit date: 04/12/1977  . Smokeless tobacco: Never Used  . Alcohol use Yes     Comment: rare occ.       Review of Systems  Constitutional: Negative.  HENT: Negative.   Eyes: Negative.   Respiratory: Negative.   Cardiovascular: Negative.   Gastrointestinal: Positive for heartburn.  Genitourinary: Negative.   Musculoskeletal: Positive for joint pain.  Skin: Negative.   Neurological: Negative.   Endo/Heme/Allergies: Negative.   Psychiatric/Behavioral: Negative.     Objective:  Physical Exam  Constitutional: She is oriented to person, place, and time. She appears well-developed.  HENT:  Head: Normocephalic.  Eyes: Pupils are equal, round, and reactive to light.  Neck: Neck supple. No JVD present. No tracheal deviation present. No thyromegaly present.  Cardiovascular: Normal rate, regular rhythm and intact distal pulses.   Respiratory: Effort normal  and breath sounds normal. No respiratory distress. She has no wheezes.  GI: Soft. There is no tenderness. There is no guarding.  Musculoskeletal:       Left hip: She exhibits decreased range of motion, decreased strength, tenderness and bony tenderness. She exhibits no swelling, no deformity and no laceration.  Lymphadenopathy:    She has no cervical adenopathy.  Neurological: She is alert and oriented to person, place, and time.  Skin: Skin is warm and dry.  Psychiatric: She has a normal mood and affect.      Labs:   Estimated body mass index is 31.41 kg/m as calculated from the following:   Height as of 01/03/17: 5\' 4"  (1.626 m).   Weight as of 01/03/17: 83 kg (183 lb).   Imaging Review Plain radiographs demonstrate severe degenerative joint disease of the left hip(s). The bone quality appears to be good for age and reported activity level.  Assessment/Plan:  End stage arthritis, left hip(s)  The patient history, physical examination, clinical judgement of the provider and imaging studies are consistent with end stage degenerative joint disease of the left hip(s) and total hip arthroplasty is deemed medically necessary. The treatment options including medical management, injection therapy, arthroscopy and arthroplasty were discussed at length. The risks and benefits of total hip arthroplasty were presented and reviewed. The risks due to aseptic loosening, infection, stiffness, dislocation/subluxation,  thromboembolic complications and other imponderables were discussed.  The patient acknowledged the explanation, agreed to proceed with the plan and consent was signed. Patient is being admitted for inpatient treatment for surgery, pain control, PT, OT, prophylactic antibiotics, VTE prophylaxis, progressive ambulation and ADL's and discharge planning.The patient is planning to be discharged home.    Anastasio AuerbachMatthew S. Estela Vinal   PA-C  01/04/2017, 10:59 PM

## 2017-01-09 ENCOUNTER — Inpatient Hospital Stay (HOSPITAL_COMMUNITY): Payer: Medicare PPO

## 2017-01-09 ENCOUNTER — Encounter (HOSPITAL_COMMUNITY): Payer: Self-pay

## 2017-01-09 ENCOUNTER — Encounter (HOSPITAL_COMMUNITY)
Admission: RE | Disposition: A | Discharge status: Home / Self Care | Payer: Self-pay | Source: Ambulatory Visit | Attending: Orthopedic Surgery

## 2017-01-09 ENCOUNTER — Inpatient Hospital Stay (HOSPITAL_COMMUNITY): Payer: Medicare PPO | Admitting: Registered Nurse

## 2017-01-09 ENCOUNTER — Inpatient Hospital Stay (HOSPITAL_COMMUNITY)
Admission: RE | Admit: 2017-01-09 | Discharge: 2017-01-10 | DRG: 470 | Disposition: A | Discharge status: Home / Self Care | Payer: Medicare PPO | Source: Ambulatory Visit | Attending: Orthopedic Surgery | Admitting: Orthopedic Surgery

## 2017-01-09 DIAGNOSIS — Z6831 Body mass index (BMI) 31.0-31.9, adult: Secondary | ICD-10-CM | POA: Diagnosis not present

## 2017-01-09 DIAGNOSIS — I129 Hypertensive chronic kidney disease with stage 1 through stage 4 chronic kidney disease, or unspecified chronic kidney disease: Secondary | ICD-10-CM | POA: Diagnosis present

## 2017-01-09 DIAGNOSIS — M1612 Unilateral primary osteoarthritis, left hip: Secondary | ICD-10-CM | POA: Diagnosis present

## 2017-01-09 DIAGNOSIS — E669 Obesity, unspecified: Secondary | ICD-10-CM | POA: Diagnosis present

## 2017-01-09 DIAGNOSIS — Z90710 Acquired absence of both cervix and uterus: Secondary | ICD-10-CM

## 2017-01-09 DIAGNOSIS — Z87891 Personal history of nicotine dependence: Secondary | ICD-10-CM

## 2017-01-09 DIAGNOSIS — E1122 Type 2 diabetes mellitus with diabetic chronic kidney disease: Secondary | ICD-10-CM | POA: Diagnosis present

## 2017-01-09 DIAGNOSIS — Z951 Presence of aortocoronary bypass graft: Secondary | ICD-10-CM | POA: Diagnosis not present

## 2017-01-09 DIAGNOSIS — K219 Gastro-esophageal reflux disease without esophagitis: Secondary | ICD-10-CM | POA: Diagnosis present

## 2017-01-09 DIAGNOSIS — Z96641 Presence of right artificial hip joint: Secondary | ICD-10-CM | POA: Diagnosis present

## 2017-01-09 DIAGNOSIS — Z9013 Acquired absence of bilateral breasts and nipples: Secondary | ICD-10-CM | POA: Diagnosis not present

## 2017-01-09 DIAGNOSIS — N189 Chronic kidney disease, unspecified: Secondary | ICD-10-CM | POA: Diagnosis present

## 2017-01-09 DIAGNOSIS — Z96649 Presence of unspecified artificial hip joint: Secondary | ICD-10-CM

## 2017-01-09 DIAGNOSIS — I2510 Atherosclerotic heart disease of native coronary artery without angina pectoris: Secondary | ICD-10-CM | POA: Diagnosis present

## 2017-01-09 DIAGNOSIS — Z96642 Presence of left artificial hip joint: Secondary | ICD-10-CM

## 2017-01-09 HISTORY — PX: TOTAL HIP ARTHROPLASTY: SHX124

## 2017-01-09 LAB — TYPE AND SCREEN
ABO/RH(D): O POS
Antibody Screen: NEGATIVE

## 2017-01-09 SURGERY — ARTHROPLASTY, HIP, TOTAL, ANTERIOR APPROACH
Anesthesia: Spinal | Site: Hip | Laterality: Left

## 2017-01-09 MED ORDER — PANTOPRAZOLE SODIUM 40 MG PO TBEC
40.0000 mg | DELAYED_RELEASE_TABLET | Freq: Every day | ORAL | Status: DC
  Administered 2017-01-10: 40 mg via ORAL
  Filled 2017-01-09: qty 1

## 2017-01-09 MED ORDER — KETOROLAC TROMETHAMINE 30 MG/ML IJ SOLN: 30.0000 mg | Freq: Once | INTRAMUSCULAR | Status: DC | PRN

## 2017-01-09 MED ORDER — PROPOFOL 500 MG/50ML IV EMUL
INTRAVENOUS | Status: DC | PRN
  Administered 2017-01-09: 25 ug/kg/min via INTRAVENOUS

## 2017-01-09 MED ORDER — PHENYLEPHRINE HCL 10 MG/ML IJ SOLN
INTRAMUSCULAR | Status: AC
  Filled 2017-01-09: qty 1

## 2017-01-09 MED ORDER — LOSARTAN POTASSIUM 50 MG PO TABS
50.0000 mg | ORAL_TABLET | Freq: Two times a day (BID) | ORAL | Status: DC
  Administered 2017-01-09 – 2017-01-10 (×2): 50 mg via ORAL
  Filled 2017-01-09 (×2): qty 1

## 2017-01-09 MED ORDER — ALLOPURINOL 100 MG PO TABS
100.0000 mg | ORAL_TABLET | Freq: Two times a day (BID) | ORAL | Status: DC
  Administered 2017-01-09 – 2017-01-10 (×2): 100 mg via ORAL
  Filled 2017-01-09 (×2): qty 1

## 2017-01-09 MED ORDER — PROPOFOL 10 MG/ML IV BOLUS
INTRAVENOUS | Status: DC | PRN
Start: 2017-01-09 — End: 2017-01-09
  Administered 2017-01-09: 20 mg via INTRAVENOUS

## 2017-01-09 MED ORDER — TRANEXAMIC ACID 1000 MG/10ML IV SOLN
1000.0000 mg | Freq: Once | INTRAVENOUS | Status: AC
  Administered 2017-01-09: 1000 mg via INTRAVENOUS
  Filled 2017-01-09: qty 1100

## 2017-01-09 MED ORDER — METOCLOPRAMIDE HCL 5 MG/ML IJ SOLN: 5.0000 mg | Freq: Three times a day (TID) | INTRAMUSCULAR | Status: DC | PRN

## 2017-01-09 MED ORDER — DIPHENHYDRAMINE HCL 25 MG PO CAPS: 25.0000 mg | ORAL_CAPSULE | Freq: Four times a day (QID) | ORAL | Status: DC | PRN

## 2017-01-09 MED ORDER — HYDROCHLOROTHIAZIDE 12.5 MG PO CAPS
12.5000 mg | ORAL_CAPSULE | Freq: Every day | ORAL | Status: DC
  Administered 2017-01-10: 12.5 mg via ORAL
  Filled 2017-01-09: qty 1

## 2017-01-09 MED ORDER — HYDROCODONE-ACETAMINOPHEN 7.5-325 MG PO TABS
1.0000 | ORAL_TABLET | ORAL | Status: DC
  Administered 2017-01-09 (×2): 1 via ORAL
  Administered 2017-01-10: 2 via ORAL
  Filled 2017-01-09 (×3): qty 1
  Filled 2017-01-09: qty 2

## 2017-01-09 MED ORDER — CHLORHEXIDINE GLUCONATE 4 % EX LIQD: 60.0000 mL | Freq: Once | CUTANEOUS | Status: DC

## 2017-01-09 MED ORDER — SODIUM CHLORIDE 0.9 % IV SOLN
INTRAVENOUS | Status: DC
  Administered 2017-01-09: 13:00:00 via INTRAVENOUS

## 2017-01-09 MED ORDER — METHOCARBAMOL 500 MG PO TABS: 500.0000 mg | ORAL_TABLET | Freq: Four times a day (QID) | ORAL | 0 refills | Status: AC | PRN

## 2017-01-09 MED ORDER — PROMETHAZINE HCL 25 MG/ML IJ SOLN: 6.2500 mg | INTRAMUSCULAR | Status: DC | PRN

## 2017-01-09 MED ORDER — DEXAMETHASONE SODIUM PHOSPHATE 10 MG/ML IJ SOLN
INTRAMUSCULAR | Status: AC
  Filled 2017-01-09: qty 1

## 2017-01-09 MED ORDER — DOCUSATE SODIUM 100 MG PO CAPS
100.0000 mg | ORAL_CAPSULE | Freq: Two times a day (BID) | ORAL | Status: DC
  Filled 2017-01-09 (×2): qty 1

## 2017-01-09 MED ORDER — FERROUS SULFATE 325 (65 FE) MG PO TABS
325.0000 mg | ORAL_TABLET | Freq: Three times a day (TID) | ORAL | Status: DC
Start: 2017-01-10 — End: 2017-01-10
  Administered 2017-01-10: 325 mg via ORAL
  Filled 2017-01-09: qty 1

## 2017-01-09 MED ORDER — MENTHOL 3 MG MT LOZG: 1.0000 | LOZENGE | OROMUCOSAL | Status: DC | PRN

## 2017-01-09 MED ORDER — STERILE WATER FOR IRRIGATION IR SOLN
Status: DC | PRN
  Administered 2017-01-09: 2000 mL

## 2017-01-09 MED ORDER — MEPERIDINE HCL 50 MG/ML IJ SOLN: 6.2500 mg | INTRAMUSCULAR | Status: DC | PRN

## 2017-01-09 MED ORDER — LIDOCAINE 2% (20 MG/ML) 5 ML SYRINGE
INTRAMUSCULAR | Status: AC
  Filled 2017-01-09: qty 5

## 2017-01-09 MED ORDER — BISACODYL 10 MG RE SUPP
10.0000 mg | Freq: Every day | RECTAL | Status: DC | PRN
Start: 2017-01-09 — End: 2017-01-10

## 2017-01-09 MED ORDER — TRANEXAMIC ACID 1000 MG/10ML IV SOLN
1000.0000 mg | INTRAVENOUS | Status: AC
  Administered 2017-01-09: 1000 mg via INTRAVENOUS
  Filled 2017-01-09: qty 1100

## 2017-01-09 MED ORDER — PROPOFOL 10 MG/ML IV BOLUS
INTRAVENOUS | Status: AC
  Filled 2017-01-09: qty 60

## 2017-01-09 MED ORDER — LACTATED RINGERS IV SOLN
INTRAVENOUS | Status: DC
  Administered 2017-01-09: 08:00:00 via INTRAVENOUS
  Administered 2017-01-09: 1000 mL via INTRAVENOUS

## 2017-01-09 MED ORDER — PHENYLEPHRINE 40 MCG/ML (10ML) SYRINGE FOR IV PUSH (FOR BLOOD PRESSURE SUPPORT)
PREFILLED_SYRINGE | INTRAVENOUS | Status: DC | PRN
  Administered 2017-01-09 (×3): 80 ug via INTRAVENOUS

## 2017-01-09 MED ORDER — DEXTROSE 5 % IV SOLN
500.0000 mg | Freq: Four times a day (QID) | INTRAVENOUS | Status: DC | PRN
  Filled 2017-01-09: qty 5

## 2017-01-09 MED ORDER — ONDANSETRON HCL 4 MG/2ML IJ SOLN: 4.0000 mg | Freq: Four times a day (QID) | INTRAMUSCULAR | Status: DC | PRN

## 2017-01-09 MED ORDER — ONDANSETRON HCL 4 MG/2ML IJ SOLN
INTRAMUSCULAR | Status: AC
  Filled 2017-01-09: qty 2

## 2017-01-09 MED ORDER — MAGNESIUM CITRATE PO SOLN: 1.0000 | Freq: Once | ORAL | Status: DC | PRN

## 2017-01-09 MED ORDER — ONDANSETRON HCL 4 MG PO TABS: 4.0000 mg | ORAL_TABLET | Freq: Four times a day (QID) | ORAL | Status: DC | PRN

## 2017-01-09 MED ORDER — METHOCARBAMOL 500 MG PO TABS
500.0000 mg | ORAL_TABLET | Freq: Four times a day (QID) | ORAL | Status: DC | PRN
  Administered 2017-01-09 – 2017-01-10 (×2): 500 mg via ORAL
  Filled 2017-01-09 (×2): qty 1

## 2017-01-09 MED ORDER — PHENOL 1.4 % MT LIQD: 1.0000 | OROMUCOSAL | Status: DC | PRN

## 2017-01-09 MED ORDER — ONDANSETRON HCL 4 MG/2ML IJ SOLN
INTRAMUSCULAR | Status: DC | PRN
  Administered 2017-01-09: 4 mg via INTRAVENOUS

## 2017-01-09 MED ORDER — POLYETHYLENE GLYCOL 3350 17 G PO PACK: 17.0000 g | PACK | Freq: Two times a day (BID) | ORAL | 0 refills | Status: AC

## 2017-01-09 MED ORDER — PHENYLEPHRINE 40 MCG/ML (10ML) SYRINGE FOR IV PUSH (FOR BLOOD PRESSURE SUPPORT)
PREFILLED_SYRINGE | INTRAVENOUS | Status: AC
  Filled 2017-01-09: qty 10

## 2017-01-09 MED ORDER — CELECOXIB 200 MG PO CAPS
200.0000 mg | ORAL_CAPSULE | Freq: Two times a day (BID) | ORAL | Status: DC
  Filled 2017-01-09 (×2): qty 1

## 2017-01-09 MED ORDER — FENTANYL CITRATE (PF) 100 MCG/2ML IJ SOLN
INTRAMUSCULAR | Status: AC
  Filled 2017-01-09: qty 2

## 2017-01-09 MED ORDER — DEXAMETHASONE SODIUM PHOSPHATE 10 MG/ML IJ SOLN
10.0000 mg | Freq: Once | INTRAMUSCULAR | Status: AC
  Administered 2017-01-09: 10 mg via INTRAVENOUS

## 2017-01-09 MED ORDER — CEFAZOLIN SODIUM-DEXTROSE 2-4 GM/100ML-% IV SOLN
2.0000 g | Freq: Four times a day (QID) | INTRAVENOUS | Status: AC
  Administered 2017-01-09 (×2): 2 g via INTRAVENOUS
  Filled 2017-01-09 (×2): qty 100

## 2017-01-09 MED ORDER — PRAVASTATIN SODIUM 20 MG PO TABS
40.0000 mg | ORAL_TABLET | Freq: Every evening | ORAL | Status: DC
  Administered 2017-01-09: 40 mg via ORAL
  Filled 2017-01-09: qty 2

## 2017-01-09 MED ORDER — APIXABAN 2.5 MG PO TABS
2.5000 mg | ORAL_TABLET | Freq: Two times a day (BID) | ORAL | Status: DC
  Administered 2017-01-10: 2.5 mg via ORAL
  Filled 2017-01-09: qty 1

## 2017-01-09 MED ORDER — DOCUSATE SODIUM 100 MG PO CAPS: 100.0000 mg | ORAL_CAPSULE | Freq: Two times a day (BID) | ORAL | 0 refills | Status: AC

## 2017-01-09 MED ORDER — ALUM & MAG HYDROXIDE-SIMETH 200-200-20 MG/5ML PO SUSP: 15.0000 mL | ORAL | Status: DC | PRN

## 2017-01-09 MED ORDER — LIDOCAINE 2% (20 MG/ML) 5 ML SYRINGE
INTRAMUSCULAR | Status: DC | PRN
  Administered 2017-01-09: 40 mg via INTRAVENOUS

## 2017-01-09 MED ORDER — METOCLOPRAMIDE HCL 5 MG PO TABS: 5.0000 mg | ORAL_TABLET | Freq: Three times a day (TID) | ORAL | Status: DC | PRN

## 2017-01-09 MED ORDER — HYDROMORPHONE HCL-NACL 0.5-0.9 MG/ML-% IV SOSY: 0.2500 mg | PREFILLED_SYRINGE | INTRAVENOUS | Status: DC | PRN

## 2017-01-09 MED ORDER — BUPIVACAINE IN DEXTROSE 0.75-8.25 % IT SOLN
INTRATHECAL | Status: DC | PRN
  Administered 2017-01-09: 1.8 mL via INTRATHECAL

## 2017-01-09 MED ORDER — LEVOTHYROXINE SODIUM 88 MCG PO TABS
88.0000 ug | ORAL_TABLET | Freq: Every day | ORAL | Status: DC
  Administered 2017-01-09: 88 ug via ORAL
  Filled 2017-01-09: qty 1

## 2017-01-09 MED ORDER — FENTANYL CITRATE (PF) 100 MCG/2ML IJ SOLN
INTRAMUSCULAR | Status: DC | PRN
  Administered 2017-01-09 (×2): 50 ug via INTRAVENOUS

## 2017-01-09 MED ORDER — DEXAMETHASONE SODIUM PHOSPHATE 10 MG/ML IJ SOLN
10.0000 mg | Freq: Once | INTRAMUSCULAR | Status: AC
  Administered 2017-01-10: 10 mg via INTRAVENOUS
  Filled 2017-01-09: qty 1

## 2017-01-09 MED ORDER — FENTANYL CITRATE (PF) 100 MCG/2ML IJ SOLN: 25.0000 ug | INTRAMUSCULAR | Status: DC | PRN

## 2017-01-09 MED ORDER — LORATADINE 10 MG PO TABS
10.0000 mg | ORAL_TABLET | Freq: Every day | ORAL | Status: DC
  Administered 2017-01-10: 10 mg via ORAL
  Filled 2017-01-09: qty 1

## 2017-01-09 MED ORDER — FERROUS SULFATE 325 (65 FE) MG PO TABS: 325.0000 mg | ORAL_TABLET | Freq: Three times a day (TID) | ORAL | 3 refills | Status: AC

## 2017-01-09 MED ORDER — PHENYLEPHRINE HCL 10 MG/ML IJ SOLN
INTRAVENOUS | Status: DC | PRN
  Administered 2017-01-09: 40 ug/min via INTRAVENOUS

## 2017-01-09 MED ORDER — POLYETHYLENE GLYCOL 3350 17 G PO PACK
17.0000 g | PACK | Freq: Two times a day (BID) | ORAL | Status: DC
  Filled 2017-01-09 (×2): qty 1

## 2017-01-09 MED ORDER — CEFAZOLIN SODIUM-DEXTROSE 2-4 GM/100ML-% IV SOLN
2.0000 g | INTRAVENOUS | Status: AC
  Administered 2017-01-09: 2 g via INTRAVENOUS

## 2017-01-09 MED ORDER — CEFAZOLIN SODIUM-DEXTROSE 2-4 GM/100ML-% IV SOLN
INTRAVENOUS | Status: AC
  Filled 2017-01-09: qty 100

## 2017-01-09 MED ORDER — HYDROCODONE-ACETAMINOPHEN 7.5-325 MG PO TABS: 1.0000 | ORAL_TABLET | ORAL | 0 refills | Status: AC | PRN

## 2017-01-09 MED ORDER — SODIUM CHLORIDE 0.9 % IR SOLN
Status: DC | PRN
  Administered 2017-01-09: 1000 mL

## 2017-01-09 MED ORDER — METOPROLOL TARTRATE 25 MG PO TABS
25.0000 mg | ORAL_TABLET | Freq: Every morning | ORAL | Status: DC
  Administered 2017-01-10: 25 mg via ORAL
  Filled 2017-01-09: qty 1

## 2017-01-09 SURGICAL SUPPLY — 34 items
BLADE SAG 18X100X1.27 (BLADE) ×2 IMPLANT
CAPT HIP TOTAL 2 ×2 IMPLANT
CLOTH BEACON ORANGE TIMEOUT ST (SAFETY) ×2 IMPLANT
COVER PERINEAL POST (MISCELLANEOUS) ×2 IMPLANT
COVER SURGICAL LIGHT HANDLE (MISCELLANEOUS) ×2 IMPLANT
DERMABOND ADVANCED (GAUZE/BANDAGES/DRESSINGS) ×1
DERMABOND ADVANCED .7 DNX12 (GAUZE/BANDAGES/DRESSINGS) ×1 IMPLANT
DRAPE STERI IOBAN 125X83 (DRAPES) ×2 IMPLANT
DRAPE U-SHAPE 47X51 STRL (DRAPES) ×4 IMPLANT
DRESSING AQUACEL AG SP 3.5X10 (GAUZE/BANDAGES/DRESSINGS) ×1 IMPLANT
DRSG AQUACEL AG SP 3.5X10 (GAUZE/BANDAGES/DRESSINGS) ×2
DURAPREP 26ML APPLICATOR (WOUND CARE) ×2 IMPLANT
ELECT REM PT RETURN 15FT ADLT (MISCELLANEOUS) ×2 IMPLANT
GLOVE BIOGEL PI IND STRL 7.5 (GLOVE) ×5 IMPLANT
GLOVE BIOGEL PI IND STRL 8.5 (GLOVE) ×1 IMPLANT
GLOVE BIOGEL PI INDICATOR 7.5 (GLOVE) ×5
GLOVE BIOGEL PI INDICATOR 8.5 (GLOVE) ×1
GLOVE ECLIPSE 8.0 STRL XLNG CF (GLOVE) ×4 IMPLANT
GLOVE ORTHO TXT STRL SZ7.5 (GLOVE) ×2 IMPLANT
GLOVE SURG SS PI 7.5 STRL IVOR (GLOVE) ×4 IMPLANT
GOWN SPEC L3 XXLG W/TWL (GOWN DISPOSABLE) ×2 IMPLANT
GOWN STRL REUS W/ TWL XL LVL3 (GOWN DISPOSABLE) ×1 IMPLANT
GOWN STRL REUS W/TWL LRG LVL3 (GOWN DISPOSABLE) ×2 IMPLANT
GOWN STRL REUS W/TWL XL LVL3 (GOWN DISPOSABLE) ×3 IMPLANT
HOLDER FOLEY CATH W/STRAP (MISCELLANEOUS) ×2 IMPLANT
PACK ANTERIOR HIP CUSTOM (KITS) ×2 IMPLANT
SUT MNCRL AB 4-0 PS2 18 (SUTURE) ×2 IMPLANT
SUT STRATAFIX 0 PDS 27 VIOLET (SUTURE) ×2
SUT VIC AB 1 CT1 36 (SUTURE) ×6 IMPLANT
SUT VIC AB 2-0 CT1 27 (SUTURE) ×2
SUT VIC AB 2-0 CT1 TAPERPNT 27 (SUTURE) ×2 IMPLANT
SUTURE STRATFX 0 PDS 27 VIOLET (SUTURE) ×1 IMPLANT
TRAY FOLEY CATH SILVER 14FR (SET/KITS/TRAYS/PACK) ×2 IMPLANT
YANKAUER SUCT BULB TIP 10FT TU (MISCELLANEOUS) ×2 IMPLANT

## 2017-01-09 NOTE — Progress Notes (Signed)
OT Cancellation Note  Patient Details Name: Kathleen AntisKatherine K Bridgeforth MRN: [id number] DOB: 1940/07/15   Cancelled Treatment:    Reason Eval/Treat Not Completed: PT screened, no needs identified, will sign off. Pt has h/o THA.  Vihana Kydd 01/09/2017, 4:06 PM  Marica OtterMaryellen Curties Conigliaro, OTR/L [phone] 01/09/2017

## 2017-01-09 NOTE — Transfer of Care (Signed)
Immediate Anesthesia Transfer of Care Note  Patient: Kathleen Raymond  Procedure(s) Performed: Procedure(s) with comments: LEFT TOTAL HIP ARTHROPLASTY ANTERIOR APPROACH (Left) - 70 mins  Patient Location: PACU  Anesthesia Type:Spinal  Level of Consciousness:  sedated, patient cooperative and responds to stimulation  Airway & Oxygen Therapy:Patient Spontanous Breathing and Patient connected to face mask oxgen  Post-op Assessment:  Report given to PACU RN and Post -op Vital signs reviewed and stable  Post vital signs:  Reviewed and stable  Last Vitals:  Vitals:   01/09/17 0523  BP: (!) 155/82  Pulse: 66  Resp: 18  Temp: 36.4 C  SpO2: 100%    Complications: No apparent anesthesia complications

## 2017-01-09 NOTE — Anesthesia Postprocedure Evaluation (Signed)
Anesthesia Post Note  Patient: Milinda AntisKatherine K Cacho  Procedure(s) Performed: Procedure(s) (LRB): LEFT TOTAL HIP ARTHROPLASTY ANTERIOR APPROACH (Left)     Patient location during evaluation: PACU Anesthesia Type: Spinal Level of consciousness: awake Pain management: pain level controlled Vital Signs Assessment: post-procedure vital signs reviewed and stable Respiratory status: spontaneous breathing Cardiovascular status: stable Postop Assessment: no headache, no backache, spinal receding, patient able to bend at knees and no signs of nausea or vomiting Anesthetic complications: no    Last Vitals:  Vitals:   01/09/17 0930 01/09/17 0945  BP: 120/65 108/61  Pulse: (!) 55 62  Resp: 16 18  Temp:    SpO2: 100% 100%    Last Pain:  Vitals:   01/09/17 1000  TempSrc:   PainSc: 0-No pain   Pain Goal: Patients Stated Pain Goal: 4 (01/09/17 0559)  LLE Motor Response: Purposeful movement (01/09/17 1000) LLE Sensation: Decreased (01/09/17 1000) RLE Motor Response: Purposeful movement (01/09/17 1000) RLE Sensation: Decreased (01/09/17 1000) L Sensory Level: L3-Anterior knee, lower leg (01/09/17 1000) R Sensory Level: L3-Anterior knee, lower leg (01/09/17 1000)  Demond Shallenberger JR,JOHN Talton Delpriore

## 2017-01-09 NOTE — Discharge Instructions (Addendum)

## 2017-01-09 NOTE — Anesthesia Preprocedure Evaluation (Addendum)
Anesthesia Evaluation  Patient identified by MRN, date of birth, ID band Patient awake    Reviewed: Allergy & Precautions, H&P , NPO status , Patient's Chart, lab work & pertinent test results, reviewed documented beta blocker date and time   History of Anesthesia Complications (+) PONV and history of anesthetic complications  Airway Mallampati: II  TM Distance: >3 FB Neck ROM: Full    Dental  (+) Teeth Intact, Poor Dentition   Pulmonary former smoker,    Pulmonary exam normal breath sounds clear to auscultation       Cardiovascular hypertension, Pt. on home beta blockers and Pt. on medications (-) Past MI Normal cardiovascular exam+ dysrhythmias Atrial Fibrillation  Rhythm:Regular Rate:Normal     Neuro/Psych negative neurological ROS  negative psych ROS   GI/Hepatic Neg liver ROS, GERD  Medicated,  Endo/Other  diabetesHypothyroidism   Renal/GU negative Renal ROS     Musculoskeletal  (+) Arthritis , Osteoarthritis,    Abdominal (+) + obese,   Peds  Hematology   Anesthesia Other Findings   Reproductive/Obstetrics negative OB ROS                            Anesthesia Physical  Anesthesia Plan  ASA: II  Anesthesia Plan: Spinal   Post-op Pain Management:    Induction:   PONV Risk Score and Plan:   Airway Management Planned: Simple Face Mask, Natural Airway and Nasal Cannula  Additional Equipment:   Intra-op Plan:   Post-operative Plan:   Informed Consent: I have reviewed the patients History and Physical, chart, labs and discussed the procedure including the risks, benefits and alternatives for the proposed anesthesia with the patient or authorized representative who has indicated his/her understanding and acceptance.   Dental advisory given  Plan Discussed with: CRNA and Surgeon  Anesthesia Plan Comments:         Anesthesia Quick Evaluation

## 2017-01-09 NOTE — Anesthesia Procedure Notes (Signed)
Spinal  Patient location during procedure: OR Start time: 01/09/2017 7:15 AM End time: 01/09/2017 7:20 AM Preanesthetic Checklist Completed: patient identified, site marked, surgical consent, pre-op evaluation, timeout performed, IV checked, risks and benefits discussed and monitors and equipment checked Spinal Block Patient position: sitting Prep: DuraPrep Patient monitoring: heart rate, cardiac monitor, continuous pulse ox and blood pressure Approach: midline Location: L3-4 Injection technique: single-shot Needle Needle type: Pencan  Needle gauge: 24 G Needle length: 9 cm Assessment Sensory level: T4 Additional Notes Free flow CSF, No paraesthesia, adequate block.

## 2017-01-09 NOTE — Interval H&P Note (Signed)
History and Physical Interval Note:  01/09/2017 6:56 AM  Kathleen Raymond  has presented today for surgery, with the diagnosis of Left hip osteoarthritis  The various methods of treatment have been discussed with the patient and family. After consideration of risks, benefits and other options for treatment, the patient has consented to  Procedure(s) with comments: LEFT TOTAL HIP ARTHROPLASTY ANTERIOR APPROACH (Left) - 70 mins as a surgical intervention .  The patient's history has been reviewed, patient examined, no change in status, stable for surgery.  I have reviewed the patient's chart and labs.  Questions were answered to the patient's satisfaction.     Shelda PalLIN,Jaquavis Felmlee D

## 2017-01-09 NOTE — Op Note (Signed)
NAME:  Kathleen Raymond                ACCOUNT NO.: [id number]      MEDICAL RECORD NO.: [id number]      [facility]      PHYSICIAN:  Durene RomansLIN,Manika Hast D  DATE OF BIRTH:  1940/08/18     DATE OF PROCEDURE:  01/09/2017                                 OPERATIVE REPORT         PREOPERATIVE DIAGNOSIS: Left  hip osteoarthritis.      POSTOPERATIVE DIAGNOSIS:  Left hip osteoarthritis.      PROCEDURE:  Left total hip replacement through an anterior approach   utilizing DePuy THR system, component size 50mm pinnacle cup, a size 32+4 neutral   Altrex liner, a size 5 Hi Tri Lock stem with a 32+1 delta ceramic   ball.      SURGEON:  Madlyn FrankelMatthew D. Charlann Boxerlin, M.D.      ASSISTANT:  Lanney GinsMatthew Babish, PA-C     ANESTHESIA:  Spinal.      SPECIMENS:  None.      COMPLICATIONS:  None.      BLOOD LOSS:  200 cc     DRAINS:  None.      INDICATION OF THE PROCEDURE:  Kathleen Raymond is a 76 y.o. female who had   presented to office for evaluation of left hip pain.  Radiographs revealed   progressive degenerative changes with bone-on-bone   articulation to the  hip joint.  The patient had painful limited range of   motion significantly affecting their overall quality of life.  The patient was failing to    respond to conservative measures, and at this point was ready   to proceed with more definitive measures.  The patient has noted progressive   degenerative changes in his hip, progressive problems and dysfunction   with regarding the hip prior to surgery.  Consent was obtained for   benefit of pain relief.  Specific risk of infection, DVT, component   failure, dislocation, need for revision surgery, as well discussion of   the anterior versus posterior approach were reviewed.  Consent was   obtained for benefit of anterior pain relief through an anterior   approach.      PROCEDURE IN DETAIL:  The patient was brought to operative theater.   Once adequate anesthesia,  preoperative antibiotics, 2 gm of Ancef, 1 gm of Tranexamic Acid, and 10 mg of Decadron administered.   The patient was positioned supine on the OSI Hanna table.  Once adequate   padding of boney process was carried out, we had predraped out the hip, and  used fluoroscopy to confirm orientation of the pelvis and position.      The left hip was then prepped and draped from proximal iliac crest to   mid thigh with shower curtain technique.      Time-out was performed identifying the patient, planned procedure, and   extremity.     An incision was then made 2 cm distal and lateral to the   anterior superior iliac spine extending over the orientation of the   tensor fascia lata muscle and sharp dissection was carried down to the   fascia of the muscle and protractor placed in the soft tissues.      The fascia  was then incised.  The muscle belly was identified and swept   laterally and retractor placed along the superior neck.  Following   cauterization of the circumflex vessels and removing some pericapsular   fat, a second cobra retractor was placed on the inferior neck.  A third   retractor was placed on the anterior acetabulum after elevating the   anterior rectus.  A L-capsulotomy was along the line of the   superior neck to the trochanteric fossa, then extended proximally and   distally.  Tag sutures were placed and the retractors were then placed   intracapsular.  We then identified the trochanteric fossa and   orientation of my neck cut, confirmed this radiographically   and then made a neck osteotomy with the femur on traction.  The femoral   head was removed without difficulty or complication.  Traction was let   off and retractors were placed posterior and anterior around the   acetabulum.      The labrum and foveal tissue were debrided.  I began reaming with a 45mm   reamer and reamed up to 49mm reamer with good bony bed preparation and a 50mm   cup was chosen.  The final 50mm  Pinnacle cup was then impacted under fluoroscopy  to confirm the depth of penetration and orientation with respect to   abduction.  A screw was placed followed by the hole eliminator.  The final   32+4 neutral Altrex liner was impacted with good visualized rim fit.  The cup was positioned anatomically within the acetabular portion of the pelvis.      At this point, the femur was rolled at 80 degrees.  Further capsule was   released off the inferior aspect of the femoral neck.  I then   released the superior capsule proximally.  The hook was placed laterally   along the femur and elevated manually and held in position with the bed   hook.  The leg was then extended and adducted with the leg rolled to 100   degrees of external rotation.  Once the proximal femur was fully   exposed, I used a box osteotome to set orientation.  I then began   broaching with the starting chili pepper broach and passed this by hand and then broached up to the 5.  With the 5 broach in place I chose a high offset neck and did a trial several trial reductions.  The offset was appropriate, leg lengths   appeared to be equal best matched to her right THR with the +1 head ball confirmed radiographically.   Given these findings, I went ahead and dislocated the hip, repositioned all   retractors and positioned the right hip in the extended and abducted position.  The final 5 Hi Tri Lock stem was   chosen and it was impacted down to the level of neck cut.  Based on this   and the trial reduction, a 32+1 delta ceramic ball was chosen and   impacted onto a clean and dry trunnion, and the hip was reduced.  The   hip had been irrigated throughout the case again at this point.  I did   reapproximate the superior capsular leaflet to the anterior leaflet   using #1 Vicryl.  The fascia of the   tensor fascia lata muscle was then reapproximated using #1 Vicryl and #0 Stratafix sutures.  The   remaining wound was closed with 2-0 Vicryl  and running 4-0 Monocryl.  The hip was cleaned, dried, and dressed sterilely using Dermabond and   Aquacel dressing.  She was then brought   to recovery room in stable condition tolerating the procedure well.    Lanney Gins, PA-C was present for the entirety of the case involved from   preoperative positioning, perioperative retractor management, general   facilitation of the case, as well as primary wound closure as assistant.            Madlyn Frankel Charlann Boxer, M.D.        01/09/2017 8:38 AM

## 2017-01-09 NOTE — Evaluation (Signed)
Physical Therapy Evaluation Patient Details Name: Kathleen Raymond MRN: [id number] DOB: 1941/03/19 Today's Date: 01/09/2017   History of Present Illness  L DATHA  Clinical Impression  The patient tolerated very well. Plans DC to home tomorrow. Pt admitted with above diagnosis. Pt currently with functional limitations due to the deficits listed below (see PT Problem List).  Pt will benefit from skilled PT to increase their independence and safety with mobility to allow discharge to the venue listed below.       Follow Up Recommendations DC plan and follow up therapy as arranged by surgeon    Equipment Recommendations  None recommended by PT    Recommendations for Other Services       Precautions / Restrictions Precautions Precautions: Fall Restrictions Weight Bearing Restrictions: No      Mobility  Bed Mobility Overal bed mobility: Needs Assistance Bed Mobility: Supine to Sit     Supine to sit: Min guard;HOB elevated     General bed mobility comments: patient moved left leg withut assistance  Transfers Overall transfer level: Needs assistance Equipment used: Rolling walker (2 wheeled) Transfers: Sit to/from Stand Sit to Stand: Min assist         General transfer comment: cues for hand and left leg position  Ambulation/Gait Ambulation/Gait assistance: Min assist Ambulation Distance (Feet): 200 Feet Assistive device: Rolling walker (2 wheeled) Gait Pattern/deviations: Step-through pattern;Step-to pattern     General Gait Details: cues for sequence  Stairs            Wheelchair Mobility    Modified Rankin (Stroke Patients Only)       Balance                                             Pertinent Vitals/Pain Pain Assessment: 0-10 Pain Score: 2  Pain Location: left hip Pain Descriptors / Indicators: Burning Pain Intervention(s): Monitored during session;Premedicated before session;Ice applied    Home Living  Family/patient expects to be discharged to:: Private residence Living Arrangements: Children Available Help at Discharge: Family Type of Home: House Home Access: Level entry     Home Layout: One level Home Equipment: Environmental consultant - 2 wheels      Prior Function Level of Independence: Independent               Hand Dominance        Extremity/Trunk Assessment   Upper Extremity Assessment Upper Extremity Assessment: Overall WFL for tasks assessed    Lower Extremity Assessment Lower Extremity Assessment: LLE deficits/detail LLE Deficits / Details: able to advance the leg    Cervical / Trunk Assessment Cervical / Trunk Assessment: Kyphotic  Communication   Communication: No difficulties  Cognition Arousal/Alertness: Awake/alert Behavior During Therapy: WFL for tasks assessed/performed Overall Cognitive Status: Within Functional Limits for tasks assessed                                        General Comments      Exercises     Assessment/Plan    PT Assessment Patient needs continued PT services  PT Problem List Decreased strength;Decreased range of motion;Decreased activity tolerance;Decreased mobility;Decreased knowledge of precautions;Decreased safety awareness;Decreased knowledge of use of DME;Pain       PT Treatment Interventions DME instruction;Gait training;Stair training;Functional mobility  training;Therapeutic activities;Patient/family education;Therapeutic exercise    PT Goals (Current goals can be found in the Care Plan section)  Acute Rehab PT Goals Patient Stated Goal: to go home. PT Goal Formulation: With patient Time For Goal Achievement: 01/10/17 Potential to Achieve Goals: Good    Frequency 7X/week   Barriers to discharge        Co-evaluation               AM-PAC PT "6 Clicks" Daily Activity  Outcome Measure Difficulty turning over in bed (including adjusting bedclothes, sheets and blankets)?: None Difficulty  moving from lying on back to sitting on the side of the bed? : None Difficulty sitting down on and standing up from a chair with arms (e.g., wheelchair, bedside commode, etc,.)?: A Little Help needed moving to and from a bed to chair (including a wheelchair)?: A Little Help needed walking in hospital room?: A Little Help needed climbing 3-5 steps with a railing? : A Little 6 Click Score: 20    End of Session   Activity Tolerance: Patient tolerated treatment well Patient left: in chair;with call bell/phone within reach;with family/visitor present Nurse Communication: Mobility status PT Visit Diagnosis: Difficulty in walking, not elsewhere classified (R26.2)    Time: [id number] PT Time Calculation (min) (ACUTE ONLY): 22 min   Charges:   PT Evaluation $PT Eval Low Complexity: 1 Low     PT G CodesBlanchard Kelch:        Yaritzel Stange PT [phone]   Rada HayHill, Phala Schraeder Elizabeth 01/09/2017, 3:00 PM

## 2017-01-10 LAB — BASIC METABOLIC PANEL
ANION GAP: 9 (ref 5–15)
BUN: 21 mg/dL — ABNORMAL HIGH (ref 6–20)
CALCIUM: 8.9 mg/dL (ref 8.9–10.3)
CHLORIDE: 101 mmol/L (ref 101–111)
CO2: 25 mmol/L (ref 22–32)
Creatinine, Ser: 1.13 mg/dL — ABNORMAL HIGH (ref 0.44–1.00)
GFR calc non Af Amer: 46 mL/min — ABNORMAL LOW (ref >60)
GFR, EST AFRICAN AMERICAN: 54 mL/min — AB (ref >60)
GLUCOSE: 134 mg/dL — AB (ref 65–99)
POTASSIUM: 4.3 mmol/L (ref 3.5–5.1)
Sodium: 135 mmol/L (ref 135–145)

## 2017-01-10 LAB — CBC
HEMATOCRIT: 30.6 % — AB (ref 36.0–46.0)
HEMOGLOBIN: 10.5 g/dL — AB (ref 12.0–15.0)
MCH: 31.4 pg (ref 26.0–34.0)
MCHC: 34.3 g/dL (ref 30.0–36.0)
MCV: 91.6 fL (ref 78.0–100.0)
Platelets: 236 10*3/uL (ref 150–400)
RBC: 3.34 MIL/uL — AB (ref 3.87–5.11)
RDW: 13.9 % (ref 11.5–15.5)
WBC: 10.1 10*3/uL (ref 4.0–10.5)

## 2017-01-10 NOTE — Progress Notes (Signed)
Physical Therapy Treatment Patient Details Name: Kathleen Raymond MRN: [id number] DOB: Jun 20, 1940 Today's Date: 01/10/2017    History of Present Illness L DATHA    PT Comments    Ready for DC   Follow Up Recommendations  DC plan and follow up therapy as arranged by surgeon     Equipment Recommendations       Recommendations for Other Services       Precautions / Restrictions Precautions Precautions: Fall Restrictions Weight Bearing Restrictions: Yes LLE Weight Bearing: Weight bearing as tolerated    Mobility  Bed Mobility   Bed Mobility: Supine to Sit;Sit to Supine     Supine to sit: Min assist Sit to supine: Min assist   General bed mobility comments: assist the left leg onto high bed  Transfers Overall transfer level: Needs assistance Equipment used: Rolling walker (2 wheeled) Transfers: Sit to/from Stand Sit to Stand: Min assist         General transfer comment: cues for hand and left leg position  Ambulation/Gait Ambulation/Gait assistance: Min assist Ambulation Distance (Feet): 200 Feet Assistive device: Rolling walker (2 wheeled) Gait Pattern/deviations: Step-to pattern;Step-through pattern     General Gait Details: cues for sequence   Stairs            Wheelchair Mobility    Modified Rankin (Stroke Patients Only)       Balance                                            Cognition Arousal/Alertness: Awake/alert                                            Exercises Total Joint Exercises Ankle Circles/Pumps: AROM;Both;10 reps Quad Sets: AROM;Both;10 reps Short Arc Quad: AROM;Left;10 reps Heel Slides: AAROM;Left;10 reps Hip ABduction/ADduction: AAROM;Left Long Arc Quad: AROM;Left;10 reps    General Comments        Pertinent Vitals/Pain Pain Score: 5  Pain Location: left hip Pain Descriptors / Indicators: Burning Pain Intervention(s): Monitored during session;Premedicated before  session    Home Living                      Prior Function            PT Goals (current goals can now be found in the care plan section) Progress towards PT goals: Progressing toward goals    Frequency    7X/week      PT Plan Current plan remains appropriate    Co-evaluation              AM-PAC PT "6 Clicks" Daily Activity  Outcome Measure  Difficulty turning over in bed (including adjusting bedclothes, sheets and blankets)?: A Little Difficulty moving from lying on back to sitting on the side of the bed? : A Little Difficulty sitting down on and standing up from a chair with arms (e.g., wheelchair, bedside commode, etc,.)?: A Little Help needed moving to and from a bed to chair (including a wheelchair)?: A Little Help needed walking in hospital room?: A Little Help needed climbing 3-5 steps with a railing? : A Little 6 Click Score: 18    End of Session   Activity Tolerance: Patient tolerated treatment well Patient left: in  chair;with call bell/phone within reach;with family/visitor present Nurse Communication: Mobility status PT Visit Diagnosis: Difficulty in walking, not elsewhere classified (R26.2)     Time: 1610-9604 PT Time Calculation (min) (ACUTE ONLY): 34 min  Charges:  $Gait Training: 8-22 mins $Therapeutic Exercise: 8-22 mins                    G CodesBlanchard Kelch PT [phone]    Rada Hay 01/10/2017, 12:41 PM

## 2017-01-10 NOTE — Progress Notes (Signed)
     Subjective: 1 Day Post-Op Procedure(s) (LRB): LEFT TOTAL HIP ARTHROPLASTY ANTERIOR APPROACH (Left)   Patient reports pain as mild, pain controlled.  No events throughout the night.  Feels that they are already progressing well.  Ready to be discharged home.   Objective:   VITALS:   Vitals:   01/10/17 0228 01/10/17 0635  BP: 127/60 (!) 137/59  Pulse: 68 66  Resp: 16 16  Temp: 97.6 F (36.4 C) 98.5 F (36.9 C)  SpO2: 97% 100%    Dorsiflexion/Plantar flexion intact Incision: dressing C/D/I No cellulitis present Compartment soft  LABS  Recent Labs  01/10/17 0545  HGB 10.5*  HCT 30.6*  WBC 10.1  PLT 236     Recent Labs  01/10/17 0545  NA 135  K 4.3  BUN 21*  CREATININE 1.13*  GLUCOSE 134*     Assessment/Plan: 1 Day Post-Op Procedure(s) (LRB): LEFT TOTAL HIP ARTHROPLASTY ANTERIOR APPROACH (Left) Foley cath  Advance diet Up with therapy D/C IV fluids Discharge home [facility]. Follow up with OLIN,Margie Brink D in 2 weeks.  Contact information:  [facility] [address], [address] [place] [id number] [phone]        Anastasio AuerbachMatthew S. Coreen Shippee   PAC  01/10/2017, 8:50 AM

## 2017-01-11 NOTE — Discharge Summary (Signed)
Physician Discharge Summary  Patient ID: Kathleen Raymond MRN: [id number] DOB/AGE: Apr 06, 1941 76 y.o.  Admit date: 01/09/2017 Discharge date: 01/10/2017   Procedures:  Procedure(s) (LRB): LEFT TOTAL HIP ARTHROPLASTY ANTERIOR APPROACH (Left)  Attending Physician:  Dr. Durene RomansMatthew Olin   Admission Diagnoses:   Left hip primary OA / pain  Discharge Diagnoses:  Principal Problem:   S/P right THA, AA  Past Medical History:  Diagnosis Date  . Arthritis 04-12-12   osteoarthritis  . Chronic kidney disease   . Coronary artery disease   . Diabetes mellitus without complication (HCC)    ? never been on meds controlled with diet   . Dysrhythmia    past hx. Atrial Fib-no problems now  . GERD (gastroesophageal reflux disease)   . History of kidney stones    x2-lithotripsy done  . Hx of transfusion of packed red blood cells   . Hypertension   . Hypothyroidism   . PONV (postoperative nausea and vomiting)     HPI:    Kathleen Raymond, 76 y.o. female, has a history of pain and functional disability in the left hip(s) due to arthritis and patient has failed non-surgical conservative treatments for greater than 12 weeks to include NSAID's and/or analgesics, corticosteriod injections and activity modification.  Onset of symptoms was gradual starting <1 years ago with rapidlly worsening course since that time.The patient noted no past surgery on the left hip(s).  Patient currently rates pain in the left hip at 8 out of 10 with activity. Patient has night pain, worsening of pain with activity and weight bearing, trendelenberg gait, pain that interfers with activities of daily living and pain with passive range of motion. Patient has evidence of periarticular osteophytes and joint space narrowing by imaging studies. This condition presents safety issues increasing the risk of falls. There is no current active infection.   Risks, benefits and expectations were discussed with the patient.  Risks  including but not limited to the risk of anesthesia, blood clots, nerve damage, blood vessel damage, failure of the prosthesis, infection and up to and including death.  Patient understand the risks, benefits and expectations and wishes to proceed with surgery.  PCP: Vernona Riegerlark, Trystan, MD   Discharged Condition: good  Hospital Course:  Patient underwent the above stated procedure on 01/09/2017. Patient tolerated the procedure well and brought to the recovery room in good condition and subsequently to the floor.  POD #1 BP: 137/59 ; Pulse: 66 ; Temp: 98.5 F (36.9 C) ; Resp: 16 Patient reports pain as mild, pain controlled.  No events throughout the night.  Feels that they are already progressing well.  Ready to be discharged home.  Dorsiflexion/plantar flexion intact, incision: dressing C/D/I, no cellulitis present and compartment soft.   LABS  Basename    HGB     10.5  HCT     30.6     Discharge Exam: General appearance: alert, cooperative and no distress Extremities: Homans sign is negative, no sign of DVT, no edema, redness or tenderness in the calves or thighs and no ulcers, gangrene or trophic changes  Disposition: Home with follow up in 2 weeks   Follow-up Information    Durene Romanslin, Oswin Griffith, MD. Schedule an appointment as soon as possible for a visit in 2 week(s).   Specialty:  Orthopedic Surgery Contact information: [address] [place] [id number] [phone]           Discharge Instructions    Call MD / Call  911    Complete by:  As directed    If you experience chest pain or shortness of breath, CALL 911 and be transported to the hospital emergency room.  If you develope a fever above 101 F, pus (white drainage) or increased drainage or redness at the wound, or calf pain, call your surgeon's office.   Change dressing    Complete by:  As directed    Maintain surgical dressing until follow up in the clinic. If the edges start to pull up, may  reinforce with tape. If the dressing is no longer working, may remove and cover with gauze and tape, but must keep the area dry and clean.  Call with any questions or concerns.   Constipation Prevention    Complete by:  As directed    Drink plenty of fluids.  Prune juice may be helpful.  You may use a stool softener, such as Colace (over the counter) 100 mg twice a day.  Use MiraLax (over the counter) for constipation as needed.   Diet - low sodium heart healthy    Complete by:  As directed    Discharge instructions    Complete by:  As directed    Maintain surgical dressing until follow up in the clinic. If the edges start to pull up, may reinforce with tape. If the dressing is no longer working, may remove and cover with gauze and tape, but must keep the area dry and clean.  Follow up in 2 weeks at [facility]. Call with any questions or concerns.   Increase activity slowly as tolerated    Complete by:  As directed    Weight bearing as tolerated with assist device (walker, cane, etc) as directed, use it as long as suggested by your surgeon or therapist, typically at least 4-6 weeks.   TED hose    Complete by:  As directed    Use stockings (TED hose) for 2 weeks on both leg(s).  You may remove them at night for sleeping.      Allergies as of 01/10/2017   No Known Allergies     Medication List    TAKE these medications   allopurinol 100 MG tablet Commonly known as:  ZYLOPRIM Take 100 mg by mouth 2 (two) times daily.   aspirin EC 81 MG tablet Take 81 mg by mouth daily.   docusate sodium 100 MG capsule Commonly known as:  COLACE Take 1 capsule (100 mg total) by mouth 2 (two) times daily.   ELIQUIS 5 MG Tabs tablet Generic drug:  apixaban Take 1 tablet by mouth 2 (two) times daily.   ferrous sulfate 325 (65 FE) MG tablet Commonly known as:  FERROUSUL Take 1 tablet (325 mg total) by mouth 3 (three) times daily with meals.   fexofenadine 180 MG tablet Commonly known  as:  ALLEGRA Take 180 mg by mouth daily as needed for allergies or rhinitis.   fish oil-omega-3 fatty acids 1000 MG capsule Take 1 g by mouth daily.   HAIR/SKIN/NAILS/BIOTIN Tabs Take 2 tablets by mouth daily.   hydrochlorothiazide 12.5 MG capsule Commonly known as:  MICROZIDE Take 12.5 mg by mouth daily.   HYDROcodone-acetaminophen 7.5-325 MG tablet Commonly known as:  NORCO Take 1-2 tablets by mouth every 4 (four) hours as needed for moderate pain or severe pain.   levothyroxine 88 MCG tablet Commonly known as:  SYNTHROID, LEVOTHROID Take 88 mcg by mouth at bedtime.   losartan 50 MG tablet Commonly known as:  COZAAR Take 50 mg by mouth 2 (two) times daily.   methocarbamol 500 MG tablet Commonly known as:  ROBAXIN Take 1 tablet (500 mg total) by mouth every 6 (six) hours as needed for muscle spasms.   metoprolol tartrate 25 MG tablet Commonly known as:  LOPRESSOR Take 25 mg by mouth every morning.   multivitamin with minerals Tabs tablet Take 1 tablet by mouth daily.   omeprazole 20 MG capsule Commonly known as:  PRILOSEC Take 20 mg by mouth daily as needed.   polyethylene glycol packet Commonly known as:  MIRALAX / GLYCOLAX Take 17 g by mouth 2 (two) times daily.   pravastatin 40 MG tablet Commonly known as:  PRAVACHOL Take 40 mg by mouth every evening.            Discharge Care Instructions        Start     Ordered   01/10/17 0000  Call MD / Call 911    Comments:  If you experience chest pain or shortness of breath, CALL 911 and be transported to the hospital emergency room.  If you develope a fever above 101 F, pus (white drainage) or increased drainage or redness at the wound, or calf pain, call your surgeon's office.   01/10/17 0853   01/10/17 0000  Discharge instructions    Comments:  Maintain surgical dressing until follow up in the clinic. If the edges start to pull up, may reinforce with tape. If the dressing is no longer working, may remove and  cover with gauze and tape, but must keep the area dry and clean.  Follow up in 2 weeks at [facility]. Call with any questions or concerns.   01/10/17 0853   01/10/17 0000  Diet - low sodium heart healthy     01/10/17 0853   01/10/17 0000  Constipation Prevention    Comments:  Drink plenty of fluids.  Prune juice may be helpful.  You may use a stool softener, such as Colace (over the counter) 100 mg twice a day.  Use MiraLax (over the counter) for constipation as needed.   01/10/17 0853   01/10/17 0000  Increase activity slowly as tolerated    Comments:  Weight bearing as tolerated with assist device (walker, cane, etc) as directed, use it as long as suggested by your surgeon or therapist, typically at least 4-6 weeks.   01/10/17 0853   01/10/17 0000  Change dressing    Comments:  Maintain surgical dressing until follow up in the clinic. If the edges start to pull up, may reinforce with tape. If the dressing is no longer working, may remove and cover with gauze and tape, but must keep the area dry and clean.  Call with any questions or concerns.   01/10/17 0853   01/10/17 0000  TED hose    Comments:  Use stockings (TED hose) for 2 weeks on both leg(s).  You may remove them at night for sleeping.   01/10/17 0853   01/09/17 0000  docusate sodium (COLACE) 100 MG capsule  2 times daily    Question:  Supervising Provider  Answer:  Durene Romans   01/09/17 0907   01/09/17 0000  ferrous sulfate (FERROUSUL) 325 (65 FE) MG tablet  3 times daily with meals    Question:  Supervising Provider  Answer:  Durene Romans   01/09/17 0907   01/09/17 0000  polyethylene glycol (MIRALAX / GLYCOLAX) packet  2 times daily    Question:  Supervising Provider  Answer:  Durene Romans   01/09/17 5621   01/09/17 0000  methocarbamol (ROBAXIN) 500 MG tablet  Every 6 hours PRN    Question:  Supervising Provider  Answer:  Durene Romans   01/09/17 0907   01/09/17 0000  HYDROcodone-acetaminophen (NORCO) 7.5-325  MG tablet  Every 4 hours PRN    Question:  Supervising Provider  Answer:  Durene Romans   01/09/17 3086       Signed: Anastasio Auerbach. Arlissa Monteverde   PA-C  01/11/2017, 12:08 PM

## 2018-10-08 IMAGING — DX DG HIP (WITH OR WITHOUT PELVIS) 1V PORT*L*
2 series · 2 of 2 positions shown · non-contrast
Comparison: 04/18/2012

CLINICAL DATA: Postop total left hip arthroplasty.

EXAM:
DG HIP (WITH OR WITHOUT PELVIS) 1V PORT LEFT

[pelvis ap]
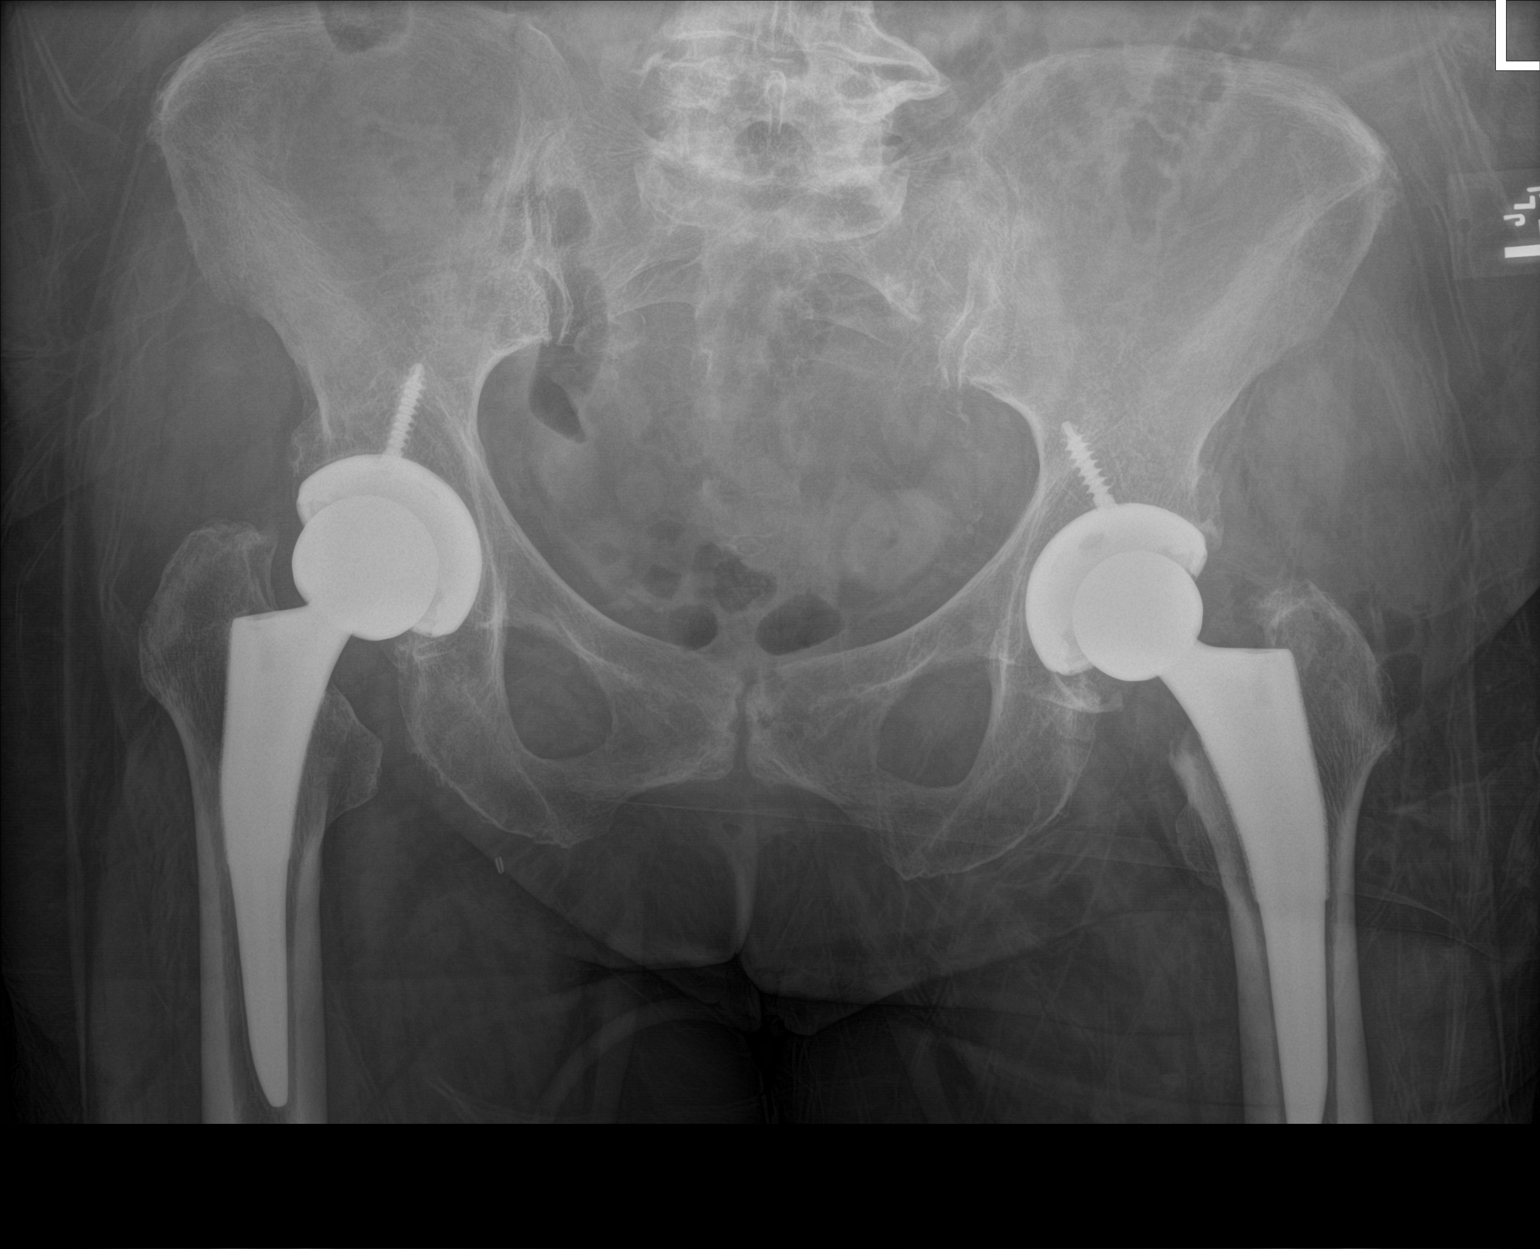

[hip frog leg]
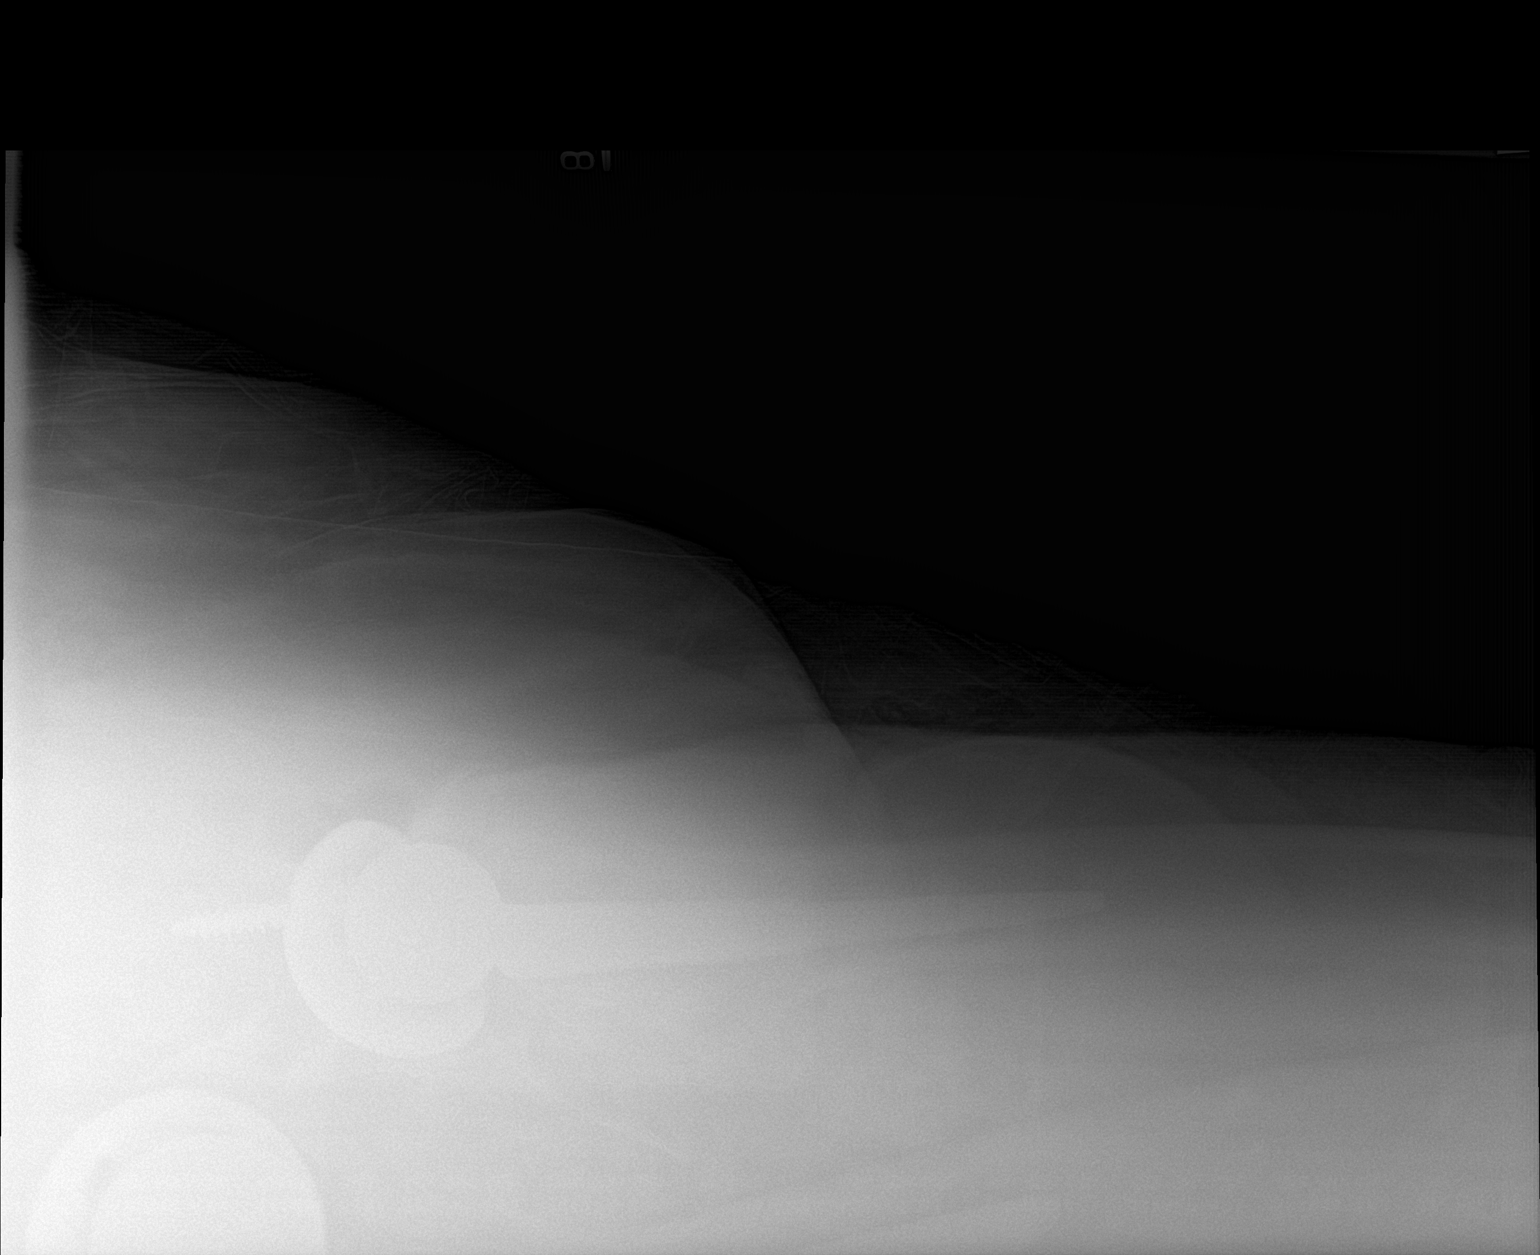

[2 of 2 positions shown; findings below may reference images not displayed]

FINDINGS: Left total hip arthroplasty components appear well seated without
complicating features. A remote right total hip arthroplasty is
noted. The bony pelvis is intact.
IMPRESSION: Well seated components of a total left hip arthroplasty without
complicating features.

## 2024-02-15 ENCOUNTER — Telehealth: Payer: Self-pay

## 2024-02-15 ENCOUNTER — Other Ambulatory Visit (HOSPITAL_COMMUNITY): Payer: Self-pay

## 2024-02-15 NOTE — Telephone Encounter (Signed)
 Error
